# Patient Record
Sex: Female | Born: 1935 | Race: White | Hispanic: No | Marital: Married | Smoking: Never smoker
Health system: Southern US, Community
[De-identification: ages and names within clinical notes are randomized; demographics above are authoritative.]

## PROBLEM LIST (undated history)

## (undated) DIAGNOSIS — M1990 Unspecified osteoarthritis, unspecified site: Secondary | ICD-10-CM

## (undated) DIAGNOSIS — C801 Malignant (primary) neoplasm, unspecified: Secondary | ICD-10-CM

## (undated) DIAGNOSIS — I639 Cerebral infarction, unspecified: Secondary | ICD-10-CM

## (undated) DIAGNOSIS — I10 Essential (primary) hypertension: Secondary | ICD-10-CM

## (undated) DIAGNOSIS — E039 Hypothyroidism, unspecified: Secondary | ICD-10-CM

## (undated) DIAGNOSIS — I2699 Other pulmonary embolism without acute cor pulmonale: Secondary | ICD-10-CM

## (undated) DIAGNOSIS — R413 Other amnesia: Secondary | ICD-10-CM

## (undated) HISTORY — DX: Other amnesia: R41.3

---

## 1979-07-17 HISTORY — PX: CHOLECYSTECTOMY: SHX55

## 1983-07-17 HISTORY — PX: HIP SURGERY: SHX245

## 2003-07-08 ENCOUNTER — Ambulatory Visit (HOSPITAL_COMMUNITY): Admission: RE | Admit: 2003-07-08 | Discharge: 2003-07-08 | Payer: Self-pay | Admitting: Internal Medicine

## 2003-07-12 ENCOUNTER — Ambulatory Visit (HOSPITAL_COMMUNITY): Admission: RE | Admit: 2003-07-12 | Discharge: 2003-07-12 | Payer: Self-pay | Admitting: Internal Medicine

## 2003-07-13 ENCOUNTER — Ambulatory Visit (HOSPITAL_COMMUNITY): Admission: RE | Admit: 2003-07-13 | Discharge: 2003-07-13 | Payer: Self-pay | Admitting: Internal Medicine

## 2003-07-20 ENCOUNTER — Ambulatory Visit (HOSPITAL_COMMUNITY): Admission: RE | Admit: 2003-07-20 | Discharge: 2003-07-20 | Payer: Self-pay | Admitting: Internal Medicine

## 2003-07-27 ENCOUNTER — Ambulatory Visit (HOSPITAL_COMMUNITY): Admission: RE | Admit: 2003-07-27 | Discharge: 2003-07-27 | Payer: Self-pay | Admitting: Internal Medicine

## 2003-12-10 ENCOUNTER — Ambulatory Visit (HOSPITAL_COMMUNITY): Admission: RE | Admit: 2003-12-10 | Discharge: 2003-12-10 | Payer: Self-pay | Admitting: Internal Medicine

## 2004-06-13 ENCOUNTER — Ambulatory Visit (HOSPITAL_COMMUNITY): Admission: RE | Admit: 2004-06-13 | Discharge: 2004-06-13 | Payer: Self-pay | Admitting: Internal Medicine

## 2004-10-06 ENCOUNTER — Ambulatory Visit (HOSPITAL_COMMUNITY): Admission: RE | Admit: 2004-10-06 | Discharge: 2004-10-06 | Payer: Self-pay | Admitting: Specialist

## 2004-10-11 ENCOUNTER — Ambulatory Visit: Payer: Self-pay | Admitting: Internal Medicine

## 2005-02-08 ENCOUNTER — Encounter (HOSPITAL_COMMUNITY): Admission: RE | Admit: 2005-02-08 | Discharge: 2005-02-09 | Payer: Self-pay | Admitting: Internal Medicine

## 2005-02-23 ENCOUNTER — Ambulatory Visit (HOSPITAL_COMMUNITY): Admission: RE | Admit: 2005-02-23 | Discharge: 2005-02-23 | Payer: Self-pay | Admitting: Family Medicine

## 2005-02-26 ENCOUNTER — Ambulatory Visit: Payer: Self-pay | Admitting: Internal Medicine

## 2005-03-08 ENCOUNTER — Ambulatory Visit (HOSPITAL_COMMUNITY): Admission: RE | Admit: 2005-03-08 | Discharge: 2005-03-08 | Payer: Self-pay | Admitting: Internal Medicine

## 2005-10-25 ENCOUNTER — Ambulatory Visit (HOSPITAL_COMMUNITY): Admission: RE | Admit: 2005-10-25 | Discharge: 2005-10-25 | Payer: Self-pay | Admitting: Family Medicine

## 2005-11-08 ENCOUNTER — Ambulatory Visit: Payer: Self-pay | Admitting: Internal Medicine

## 2005-11-08 ENCOUNTER — Encounter (HOSPITAL_COMMUNITY): Admission: RE | Admit: 2005-11-08 | Discharge: 2005-12-08 | Payer: Self-pay | Admitting: Internal Medicine

## 2005-11-08 ENCOUNTER — Ambulatory Visit (HOSPITAL_COMMUNITY): Admission: RE | Admit: 2005-11-08 | Discharge: 2005-11-08 | Payer: Self-pay | Admitting: Family Medicine

## 2005-11-09 ENCOUNTER — Ambulatory Visit (HOSPITAL_COMMUNITY): Payer: Self-pay | Admitting: Internal Medicine

## 2005-11-20 ENCOUNTER — Ambulatory Visit (HOSPITAL_COMMUNITY): Admission: RE | Admit: 2005-11-20 | Discharge: 2005-11-20 | Payer: Self-pay | Admitting: Internal Medicine

## 2005-11-20 ENCOUNTER — Ambulatory Visit: Payer: Self-pay | Admitting: Internal Medicine

## 2005-12-12 ENCOUNTER — Ambulatory Visit (HOSPITAL_COMMUNITY): Admission: RE | Admit: 2005-12-12 | Discharge: 2005-12-12 | Payer: Self-pay | Admitting: Internal Medicine

## 2005-12-12 ENCOUNTER — Encounter (INDEPENDENT_AMBULATORY_CARE_PROVIDER_SITE_OTHER): Payer: Self-pay | Admitting: *Deleted

## 2006-02-27 ENCOUNTER — Ambulatory Visit: Payer: Self-pay | Admitting: Internal Medicine

## 2006-04-24 ENCOUNTER — Ambulatory Visit: Payer: Self-pay | Admitting: Internal Medicine

## 2006-07-19 ENCOUNTER — Ambulatory Visit: Payer: Self-pay | Admitting: Internal Medicine

## 2006-11-08 ENCOUNTER — Ambulatory Visit: Payer: Self-pay | Admitting: Internal Medicine

## 2006-11-08 DIAGNOSIS — I10 Essential (primary) hypertension: Secondary | ICD-10-CM | POA: Insufficient documentation

## 2006-11-08 DIAGNOSIS — E042 Nontoxic multinodular goiter: Secondary | ICD-10-CM

## 2006-11-08 DIAGNOSIS — R000 Tachycardia, unspecified: Secondary | ICD-10-CM

## 2006-11-08 DIAGNOSIS — D509 Iron deficiency anemia, unspecified: Secondary | ICD-10-CM | POA: Insufficient documentation

## 2006-11-08 DIAGNOSIS — E1165 Type 2 diabetes mellitus with hyperglycemia: Secondary | ICD-10-CM

## 2006-11-08 DIAGNOSIS — M129 Arthropathy, unspecified: Secondary | ICD-10-CM | POA: Insufficient documentation

## 2006-11-08 LAB — CONVERTED CEMR LAB
Blood Glucose, Fingerstick: 175
Hemoglobin: 9.6 g/dL

## 2006-11-12 ENCOUNTER — Encounter (INDEPENDENT_AMBULATORY_CARE_PROVIDER_SITE_OTHER): Payer: Self-pay | Admitting: Internal Medicine

## 2006-11-22 ENCOUNTER — Ambulatory Visit: Payer: Self-pay | Admitting: Internal Medicine

## 2006-11-25 ENCOUNTER — Ambulatory Visit (HOSPITAL_COMMUNITY): Admission: RE | Admit: 2006-11-25 | Discharge: 2006-11-25 | Payer: Self-pay | Admitting: Internal Medicine

## 2006-11-25 LAB — CONVERTED CEMR LAB
Basophils Absolute: 0 10*3/uL (ref 0.0–0.1)
Basophils Relative: 0 % (ref 0–1)
Eosinophils Absolute: 0.1 10*3/uL (ref 0.0–0.7)
Eosinophils Relative: 2 % (ref 0–5)
Ferritin: 10 ng/mL (ref 10–291)
HCT: 33.3 % — ABNORMAL LOW (ref 36.0–46.0)
Hemoglobin: 9.6 g/dL — ABNORMAL LOW (ref 12.0–15.0)
Iron: 23 ug/dL — ABNORMAL LOW (ref 42–145)
Lymphocytes Relative: 29 % (ref 12–46)
Lymphs Abs: 1.1 10*3/uL (ref 0.7–3.3)
MCHC: 28.8 g/dL — ABNORMAL LOW (ref 30.0–36.0)
MCV: 78.9 fL (ref 78.0–100.0)
Monocytes Absolute: 0.2 10*3/uL (ref 0.2–0.7)
Monocytes Relative: 6 % (ref 3–11)
Neutro Abs: 2.4 10*3/uL (ref 1.7–7.7)
Neutrophils Relative %: 63 % (ref 43–77)
Platelets: 230 10*3/uL (ref 150–400)
RBC: 4.22 M/uL (ref 3.87–5.11)
RDW: 18.6 % — ABNORMAL HIGH (ref 11.5–14.0)
WBC: 3.8 10*3/uL — ABNORMAL LOW (ref 4.0–10.5)

## 2006-11-26 ENCOUNTER — Telehealth (INDEPENDENT_AMBULATORY_CARE_PROVIDER_SITE_OTHER): Payer: Self-pay | Admitting: Internal Medicine

## 2006-12-18 ENCOUNTER — Ambulatory Visit: Payer: Self-pay | Admitting: Internal Medicine

## 2007-01-07 ENCOUNTER — Encounter (INDEPENDENT_AMBULATORY_CARE_PROVIDER_SITE_OTHER): Payer: Self-pay | Admitting: Internal Medicine

## 2007-02-17 ENCOUNTER — Ambulatory Visit: Payer: Self-pay | Admitting: Internal Medicine

## 2007-02-17 LAB — CONVERTED CEMR LAB
Hemoglobin: 8.1 g/dL
Hgb A1c MFr Bld: 6.3 %

## 2007-03-25 ENCOUNTER — Ambulatory Visit: Payer: Self-pay | Admitting: Internal Medicine

## 2007-03-31 ENCOUNTER — Ambulatory Visit: Payer: Self-pay | Admitting: Internal Medicine

## 2007-03-31 DIAGNOSIS — D518 Other vitamin B12 deficiency anemias: Secondary | ICD-10-CM | POA: Insufficient documentation

## 2007-03-31 LAB — CONVERTED CEMR LAB: Hemoglobin: 7.9 g/dL

## 2007-04-01 ENCOUNTER — Telehealth (INDEPENDENT_AMBULATORY_CARE_PROVIDER_SITE_OTHER): Payer: Self-pay | Admitting: Internal Medicine

## 2007-04-01 LAB — CONVERTED CEMR LAB
ALT: 12 units/L (ref 0–35)
AST: 14 units/L (ref 0–37)
Albumin: 3.5 g/dL (ref 3.5–5.2)
Alkaline Phosphatase: 106 units/L (ref 39–117)
BUN: 8 mg/dL (ref 6–23)
Basophils Absolute: 0 10*3/uL (ref 0.0–0.1)
Basophils Relative: 0 % (ref 0–1)
CO2: 24 meq/L (ref 19–32)
Calcium: 8.2 mg/dL — ABNORMAL LOW (ref 8.4–10.5)
Chloride: 108 meq/L (ref 96–112)
Cholesterol: 143 mg/dL (ref 0–200)
Creatinine, Ser: 0.68 mg/dL (ref 0.40–1.20)
Eosinophils Absolute: 0.1 10*3/uL (ref 0.0–0.7)
Eosinophils Relative: 3 % (ref 0–5)
Ferritin: 8 ng/mL — ABNORMAL LOW (ref 10–291)
Glucose, Bld: 185 mg/dL — ABNORMAL HIGH (ref 70–99)
HCT: 30.6 % — ABNORMAL LOW (ref 36.0–46.0)
HDL: 40 mg/dL (ref >39)
Hemoglobin: 8.9 g/dL — ABNORMAL LOW (ref 12.0–15.0)
Iron: 23 ug/dL — ABNORMAL LOW (ref 42–145)
LDL Cholesterol: 82 mg/dL (ref 0–99)
Lymphocytes Relative: 29 % (ref 12–46)
Lymphs Abs: 1.1 10*3/uL (ref 0.7–3.3)
MCHC: 29.1 g/dL — ABNORMAL LOW (ref 30.0–36.0)
MCV: 78.9 fL (ref 78.0–100.0)
Monocytes Absolute: 0.3 10*3/uL (ref 0.2–0.7)
Monocytes Relative: 7 % (ref 3–11)
Neutro Abs: 2.3 10*3/uL (ref 1.7–7.7)
Neutrophils Relative %: 61 % (ref 43–77)
Platelets: 167 10*3/uL (ref 150–400)
Potassium: 4 meq/L (ref 3.5–5.3)
RBC: 3.88 M/uL (ref 3.87–5.11)
RDW: 16 % — ABNORMAL HIGH (ref 11.5–14.0)
Saturation Ratios: 7 % — ABNORMAL LOW (ref 20–55)
Sodium: 141 meq/L (ref 135–145)
TIBC: 352 ug/dL (ref 250–470)
TSH: 2.322 microintl units/mL (ref 0.350–5.50)
Total Bilirubin: 1.1 mg/dL (ref 0.3–1.2)
Total CHOL/HDL Ratio: 3.6
Total Protein: 6.5 g/dL (ref 6.0–8.3)
Triglycerides: 103 mg/dL (ref <150)
UIBC: 329 ug/dL
VLDL: 21 mg/dL (ref 0–40)
Vitamin B-12: 1058 pg/mL — ABNORMAL HIGH (ref 211–911)
WBC: 3.7 10*3/uL — ABNORMAL LOW (ref 4.0–10.5)

## 2007-04-03 ENCOUNTER — Telehealth (INDEPENDENT_AMBULATORY_CARE_PROVIDER_SITE_OTHER): Payer: Self-pay | Admitting: Internal Medicine

## 2007-04-14 ENCOUNTER — Ambulatory Visit: Payer: Self-pay | Admitting: Internal Medicine

## 2007-04-14 DIAGNOSIS — I872 Venous insufficiency (chronic) (peripheral): Secondary | ICD-10-CM | POA: Insufficient documentation

## 2007-04-14 LAB — CONVERTED CEMR LAB: Hemoglobin: 8.6 g/dL

## 2007-05-13 ENCOUNTER — Ambulatory Visit: Payer: Self-pay | Admitting: Internal Medicine

## 2007-05-26 ENCOUNTER — Ambulatory Visit: Payer: Self-pay | Admitting: Internal Medicine

## 2007-05-26 DIAGNOSIS — R252 Cramp and spasm: Secondary | ICD-10-CM

## 2007-05-26 LAB — CONVERTED CEMR LAB
Blood Glucose, Fingerstick: 190
Hgb A1c MFr Bld: 6.6 %

## 2007-07-17 HISTORY — PX: COLON SURGERY: SHX602

## 2007-10-10 ENCOUNTER — Encounter (INDEPENDENT_AMBULATORY_CARE_PROVIDER_SITE_OTHER): Payer: Self-pay | Admitting: Internal Medicine

## 2007-12-29 ENCOUNTER — Encounter (INDEPENDENT_AMBULATORY_CARE_PROVIDER_SITE_OTHER): Payer: Self-pay | Admitting: Internal Medicine

## 2008-02-27 ENCOUNTER — Ambulatory Visit: Payer: Self-pay | Admitting: Cardiology

## 2008-05-11 ENCOUNTER — Ambulatory Visit (HOSPITAL_COMMUNITY): Admission: RE | Admit: 2008-05-11 | Discharge: 2008-05-11 | Payer: Self-pay | Admitting: Internal Medicine

## 2010-11-28 NOTE — Assessment & Plan Note (Signed)
NAMEKELSE, PLOCH                CHART#:  [id number]   DATE:  12/18/2006                       DOB:  Dec 26, 1934   PRESENTING COMPLAINT:  Followup for iron-deficiency anemia.   SUBJECTIVE:  Ambrie is a 76 year old Caucasian female who is here for  scheduled visit.  She was last seen in January 2008.   She has iron-deficiency anemia.  She also has history of B12 deficiency  which was diagnosed by Dr. Mirna Mires.  As far as her iron-deficiency  anemia is concerned she has undergone endoscopic evaluation.   She had colonoscopy initially in December 2004 for weight loss and  diarrhea and anemia, and she had normal exam other than hemorrhoids.   She had EGD in January 2005, and she had antral erythema and erosions  but no evidence of esophageal varices or gastric varices.  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  She has been advised on liver biopsy and other studies, but she has  declined.  Her H&H dropped again in April last year and she was given 2  units of PRBCs.  She had small bowel Given capsule study on Nov 20, 2005.  She had multiple gastric erosions without stigmata of bleeding and  villous prominence to duodenal and proximal jejunal mucosa.  She  therefore had EGD with duodenal biopsy.  There was no evidence of  villous atrophy or inflammation to suggest celiac disease.  She has been  maintained on iron since then.   She does not have any symptoms.  She has noted some heartburn lately  after meals.  Her stool has been dark since she is on iron.  She has not  had any rectal bleeding.  She also denies vaginal bleeding or hematuria.  She apparently saw Dr. Jen Mow as her primary care physician.  Since her  hemoglobin A1c was 6.1, Dr. Jen Mow suggested stopping her glimepiride.  Her glucose went up to 300, and she is now back on it.  She denies  anorexia or weight loss.  She has been under stress lately because her  father was in the  hospital with pneumonia and today is being transferred  to [facility].   She is on ferrous sulfate one b.i.d., B6 daily, Synthroid 25 mcg daily,  B12 injection once a month, glimepiride 4 mg q.a.m. and metformin 500 mg  daily p.r.n.   OBJECTIVE:  VITAL SIGNS:  Weight 169 pounds.  She is 5 feet 6 inches  tall.  Pulse 88 per minute.  Blood pressure 130/70.  Temp is 98.4.  HEENT:  Conjunctivae are somewhat pale.  Sclerae nonicteric.  Oropharyngeal mucosa is normal.  No adenopathy noted.  She has mild  thyromegaly.  ABDOMEN:  Soft and nontender.  No splenomegaly or hepatomegaly.  EXTREMITIES:  She does not have peripheral edema, clubbing or  koilonychia.   H&H from October 28, 2006, was 9.4 and 32.2.   H&H from Nov 22, 2006, was 9.6 and 33.3.  MCV was 78.9.  Serum iron 23.  Ferritin was 10 ng/mL.   ASSESSMENT:  Preslyn has iron-deficiency anemia.  Her stool was guaiac  negative on her last visit.  She is obviously not responding to oral  iron therapy.  She has had fairly extensive workup.  She possibly has  impaired iron absorption  and may need parenteral iron therapy.  Before  this is carried out I would like for her to try a different iron  preparation.   PLAN:  1. Discontinue ferrous sulfate.  2. Will start Nu-Iron 150 b.i.d., prescription given for #60 with five      refills.  She will have H&H repeated in one month from now.   I asked Kristeen to call Dr. Jen Mow' office and let them know about her  blood glucose levels and the fact that she is back on glimepiride.       Lionel December, M.D.  Electronically Signed     NR/MEDQ  D:  12/19/2006  T:  12/19/2006  Job:  [id number]   cc:   Erle Crocker, M.D.

## 2010-12-01 NOTE — Op Note (Signed)
Valerie Boyd, Valerie Boyd               ACCOUNT NO.:  [id number]   MEDICAL RECORD NO.:  [id number]          PATIENT TYPE:  AMB   LOCATION:  DAY                           FACILITY:  [facility]   PHYSICIAN:  Lionel December, M.D.    DATE OF BIRTH:  [birth year]   DATE OF PROCEDURE:  12/12/2005  DATE OF DISCHARGE:                                 OPERATIVE REPORT   PROCEDURE:  Esophagogastroduodenoscopy with duodenal biopsy.   INDICATIONS:  Valerie Boyd is a 76 year old Caucasian female with history of iron-  deficiency anemia, who was recently noted to have a hemoglobin of 6.6 and  heme-positive stool.  She was given 2 units of PRBCs.  She had a Given  capsule study since she has had a prior EGD and colonoscopy.  She has  villous blunting and edema to duodenal and jejunal mucosa.  She is therefore  undergoing EGD with duodenal biopsy.  Procedure and risks were reviewed with  the patient and informed consent was obtained.   MEDICATIONS FOR CONSCIOUS SEDATION:  Benzocaine spray for pharyngeal topical  anesthesia, Demerol 25 mg IV, Versed 4 mg IV.   FINDINGS:  Procedure performed in endoscopy suite.  The patient's vital  signs and O2 saturation were monitored during the procedure and remained  stable.  The patient was placed in the left lateral recumbent position and  the Olympus video scope was passed via oropharynx without any difficulty  into esophagus.   Esophagus:  Mucosa was normal throughout.  The GE junction was at 35 cm from  the incisors.  There were no esophageal varices noted.   Stomach:  It was empty and distended very well with insufflation.  Folds of  the proximal stomach were normal.  Examination of the mucosa revealed two  erosions at antrum and round red rings with pale centers at body and antrum,  none of these was bleeding.  Pictures taken for the record.  Pyloric channel  was patent.  Angularis, fundus and cardia were examined by retroflexing the  scope and were normal.  No fundal  varices identified.   Duodenum:  Bulbar mucosa was normal.  The scope was passed in the second and  third part of duodenum, where there was mucosal edema with thickening of the  folds.  A biopsy was taken from the second and third part and submitted in  one container.  There was a single small diverticulum involving third part  of the duodenum.  Endoscope was withdrawn.  The patient tolerated the  procedure well.   FINAL DIAGNOSES:  1.  No evidence of esophageal or gastric varices.  2.  Two gastric erosions at antrum.  Please note her H. pylori serology last      year was negative.  3.  Localized areas with mucosal erythema, possibly indicative of early      portal gastropathy, or these could be arteriovenous malformations.  None      of these were bleeding and therefore treated.  4.  Mucosal edema to postbulbar duodenum.  Biopsy taken to rule out mucosal      disease.  These  changes may well be due to underlying portal      hypertension since she has known to mesenteric varices, although she has      declined to have hepatoportal pressures and liver biopsy.   RECOMMENDATIONS:  The patient will continue ferrous sulfate 325 mg b.i.d.  She will have H&H today.  I will be contacting her with biopsy results and  further recommendations.      Lionel December, M.D.  Electronically Signed     NR/MEDQ  D:  12/12/2005  T:  12/12/2005  Job:  [id number]   cc:   Annia Friendly. Loleta Chance, MD  Fax: [phone]

## 2010-12-01 NOTE — H&P (Signed)
[name], [name]               ACCOUNT NO.:  [id number]   MEDICAL RECORD NO.:  [id number]          PATIENT TYPE:  AMB   LOCATION:  DAY                           FACILITY:  [facility]   PHYSICIAN:  Lionel December, M.D.    DATE OF BIRTH:  05-28-1935   DATE OF ADMISSION:  DATE OF DISCHARGE:  LH                                HISTORY & PHYSICAL   PRESENTING COMPLAINT:  Low hemoglobin of 6.6 g.   HISTORY OF PRESENT ILLNESS:  Valerie Boyd is 76 year old Caucasian female who is  well-known to [place] who was last seen in August and noted to have persistent  anemia.  She has history of iron deficiency anemia dating back to 2005 which  is reviewed in the past medical history.  At that time her hemoglobin was  8.9 and hematocrit was 26.8.  Given capsule study was scheduled.  However,  she had FNA of thyroid cyst and she possibly had some hemorrhage and study  was canceled and was never rescheduled.  We were under the impression that  she was going to call us and she was under the impression that we were going  to call her.  She was having some problems with iron therapy and she  discontinued it a few months ago.  She has been gradually getting weaker.  She has no exertional dyspnea after taking less than one flight of stairs.  However, she has not had any chest pain.  She saw Dr. Loleta Chance.  She had a CBC  yesterday.  Her H&H was 6.6 and 21.5.  She also had a met-7 which was  normal.  Her calcium was 8.6.  She was therefore advised to come to our  office.  Lemoyne denies melena or rectal bleeding.  Two weeks ago she had  bout of diarrhea and noted blood on the tissue but this resolved after a few  days.  She denies abdominal pain, nausea, vomiting, or heartburn.  She also  denies hematuria or vaginal bleeding.  Over the last several days she has  noted pain in her right flank area.  She had abdominal film on October 25, 2005.  She had calcification in the right pelvis felt to be consistent with  phlebolith and  probable right uterine fibroid calcification and she had DJD  changes to her right hip but no acute abnormalities were noted.  Kenzley  denies using NSAIDs or aspirin.  She recalls that she was given blood  transfusion at age 76 and never thereafter.  She has been feeling sluggish  and has been dragging over the last few weeks.   [name] had colonoscopy and EGD as below.   She is on Amaryl 4 mg q.a.m., Centrum daily, Fortamet 1000 mg q.a.m., OTC  iron 65 mg b.i.d., Benicar/HCT 40 mg daily, Coreg 3.125 mg daily, Synthroid  25 mcg daily, B6 daily, and B12 injection once a month.   PAST MEDICAL HISTORY:  1.  She has goiter which was diagnosed about 12 years ago.  2.  She had FNA by Dr. Lovell Sheehan and more recently by Dr. Patrecia Pace.  3.  She has diabetes mellitus.  Her hemoglobin A1c reportedly was normal two      weeks ago.   [name] presented with 40 pound weight loss and diarrhea back in December  2004 which was first visit to our office.  At that time she was noted to  have anemia which turned out to be due to iron deficiency.  She had serum  iron of 28, TIBC of 285, saturation was 7%, B12 was borderline at 220 and  folate was 14.7.  Her ferritin was 7.  She had colonoscopy in December 2004.  She had normal TI and normal colon.  She had external hemorrhoids.  She had  EGD in January 2005 which was negative for varices, but positive for  erosions at antrum.  Biopsy revealed gastritis and H. pylori serology was  negative.  She had abdominopelvic CT on July 13, 2003.  She had mild  splenic and hepatic prominence without focal defects where she had a very  prominent perisplenic varices.  There was a scant amount of pelvic fluid.  In May 2005 she was treated for pneumonia.  She had abnormal chest CT but  she had a follow-up chest CT in November 2005 which showed stable  interstitial changes, but no masses.   Regarding her varices __________ recommended transjugular liver biopsy along   with hepatic wedge pressure, but she declines.   Donja was last seen in our office in August 2006.  She had her anemia  profile repeated.  Her iron was 25, TIBC 352, saturation was 7%, B12 was  683, folate 18, and ferritin was 10.  She was scheduled for Given capsule  but this was postponed or canceled for reasons stated above.   She has had cholecystectomy in 1981.   ALLERGIES:  NK.   FAMILY HISTORY:  Mother died of brain tumor age 12.  She also had thyroid  disease.   SOCIAL HISTORY:  She is married.  She has two children.  She is a retired  Astronomer.  She has never smoked cigarettes and drinks wine occasionally.   OBJECTIVE:  VITAL SIGNS:  Weight 171 pounds.  She is 5 feet 5 inches tall.  Pulse 72 per minute; however, when she moved from chair to examination table  it jumped to 100 for a couple minutes.  Blood pressure 158/68, temperature  is 98.6.  HEENT:  Conjunctiva is pale.  Sclerae is non-icteric.  Oropharyngeal mucosa  is normal.  No neck masses are noted.  She has thyromegaly.  Right lobe is  larger than left lobe.  It is firm and nontender.  No lymphadenopathy noted.  CARDIAC:  Regular rhythm.  Normal S1, S2.  No murmur or gallop noted.  LUNGS:  Clear to auscultation.  BACK:  No abnormality noted to her thoracic or lumbar spine.  No tenderness.  No rebound on percussion.  No tenderness noted over CVA.  ABDOMEN:  Full, but soft and nontender.  Liver edge is palpable below RCM,  does not appear to be enlarged.  Spleen is nonpalpable.  RECTAL:  External hemorrhoids.  She has scant amount of stool in the vault  which is very dark and is faintly, but definitely heme-positive.  She has  trace edema around her ankles.   ASSESSMENT:  [name] has iron deficiency anemia dating back to at least  January 2005.  Her hemoglobin has come up with iron therapy to close to 10.5-  11 g, but never above that.  Now she  presents with significant drop in her H&H but she has minimal symptoms.  I  suspect this has been happening  gradually.  She has had EGD in December.  I suspect her drop has been very  gradual.  She has not had any overt frank bleeding.  Therefore, we could be  dealing with iron malabsorption.  She had colonoscopy in December 2004 and  EGD in January 2005.  Nothing significant was found on these studies.   She could also have small bowel AVMs or even malabsorption given that she  also has B12 deficiency which is not mentioned in her past medical history.   Given her age I would recommend that she be transfused.  In order to look at  her small bowel with a Given capsule we have to stop her iron for at least  10 days.  If we do not give her PRBCs her evaluation will have to be delayed  for a few weeks.  Pros and cons of transfusion was discussed with the  patient and she is comfortable being transfused.   RECOMMENDATIONS:  1.  Patient asked to discontinue iron therapy in preparation for Given      capsule study which will be scheduled week after next.  2.  She will be given 2 units of PRBCs via a specialty clinic in the a.m.      but she will be typed and cross-matched today.  She will have post      transfusion H&H.   Further work-up will depend on findings of Given capsule study.   As far as the flank pain is concerned I suspect this may be related to her  spine.  This will be worked up once her acute issue has been addressed.      Lionel December, M.D.  Electronically Signed     NR/MEDQ  D:  11/08/2005  T:  11/08/2005  Job:  [id number]   cc:   Annia Friendly. Loleta Chance, MD  Fax: [phone]   Dirk Dress. Katrinka Blazing, M.D.  Fax: [phone]

## 2010-12-01 NOTE — Op Note (Signed)
NAMEMALEEA, CAMILO               ACCOUNT NO.:  [id number]   MEDICAL RECORD NO.:  [id number]          PATIENT TYPE:  AMB   LOCATION:  DAY                           FACILITY:  [facility]   PHYSICIAN:  Lionel December, M.D.    DATE OF BIRTH:  1935/06/09   DATE OF PROCEDURE:  11/20/2005  DATE OF DISCHARGE:  11/20/2005                                 OPERATIVE REPORT   PROCEDURE:  Small bowel Gibbon capsule study.   INDICATIONS:  Patient is a 76 year old Caucasian female with recurrent iron  deficiency anemia which was initially diagnosed in 2005.  Recently her  hemoglobin was down to 6.6 grams.  When seen in the office recently her  stool was noted to be heme-positive.  She was given 2 units of PRBCs.  Her  iron therapy was discontinued for this study.  The procedures and risks were  reviewed with the patient, and informed consent was obtained.   FINDINGS:  The patient was able to swallow Givens capsule without any  difficulty.  It passed through esophagus quickly, and reached the stomach in  1 minute and 24 seconds.  No mucosal abnormality was noted in the esophagus.   It reached the duodenal bulb in 1 hour and 18 minutes.   Capsule never made it to the cecum, but it appears to be in the ileum or  terminal ileum by the time the study ended, which was 8 hours.   No esophageal mucosal abnormality noted.  Multiple gastric erosions noted.  None with stigmata of bleeding.   Small bowel mucosa is normal.  There is some black material which is felt to  be iron even though iron therapy was discontinued several days ago.   FINAL DIAGNOSIS:  1.  Multiple gastric erosions noted some 3-4 mm, but none with stigmata of      bleeding.  2.  There is villous edema or clubbing as well as thickening or edema to      folds in the duodenum and proximal jejunum.  Mucosa of the distal small      bowel is normal.  There was some black material felt to be iron, even      though iron therapy was stopped several  days ago.   FINAL DIAGNOSIS:  1.  Multiple gastric erosions without stigmata of bleed.  2.  Villous blunting with mucosal edema to the duodenum and proximal      jejunum; this may be related to portal hypertension since she has known      perisplenic varices.   RECOMMENDATIONS:  1.  She will resume her iron therapy as before.  2.  She will undergo diagnostic esophagogastroduodenoscopy with duodenal      biopsy to rule out celiac disease.      Lionel December, M.D.  Electronically Signed     NR/MEDQ  D:  11/30/2005  T:  11/30/2005  Job:  [id number]   cc:   Annia Friendly. Loleta Chance, MD  Fax: [phone]

## 2010-12-01 NOTE — Op Note (Signed)
NAME:  Boyd, Valerie G                         ACCOUNT NO.:  [id number]   MEDICAL RECORD NO.:  [id number]                   PATIENT TYPE:  AMB   LOCATION:  DAY                                  FACILITY:  [facility]   PHYSICIAN:  Lionel December, M.D.                 DATE OF BIRTH:  03-May-1935   DATE OF PROCEDURE:  07/12/2003  DATE OF DISCHARGE:                                 OPERATIVE REPORT   PROCEDURE:  Total colonoscopy.   INDICATIONS:  Terrian is a 76 year old Caucasian female with about a six-  week history of nonbloody diarrhea who also has had significant weight loss  and microcytic anemia.  However, there is no history of melena or rectal  bleeding.  She is undergoing colonoscopy both for diagnostic and screening  purposes.  Procedure was reviewed with the patient and informed consent was  obtained.   PREOPERATIVE MEDICATIONS:  Demerol 25 mg IV, Versed 4 mg IV in divided  doses.   FINDINGS:  The procedure was performed in the endoscopy suite.  The  patient's vital signs and O2 saturation were monitored during the procedure  and remained stable.  The patient was placed in the left lateral recumbent  position, and rectal examination performed.  She had soft sentinel skin  tags.  This exam was normal.  The scope was placed in the rectum and  advanced under vision into the sigmoid colon and beyond.  Preparation was  satisfactory.  The scope was passed to the cecum which was identified by the  ileocecal valve and appendiceal orifice.  Picture was taken for the record.  Short segment of terminal ileum was also examined and was normal.  As the  scope was withdrawn, colonic mucosa was carefully examined and was normal  throughout.  Incidentally, she had some formed stool entering in the cecum.  No diverticula were noted.  Perirectal mucosa was normal.  Scope was  retroflexed to examine the anorectal junction and she had hemorrhoids below  the dentate line.  Endoscope was straightened and  withdrawn.  The patient  tolerated the procedure well.   FINAL DIAGNOSIS:  Small external hemorrhoids; otherwise normal colonoscopy  and terminal ileoscopy.   RECOMMENDATIONS:  1. Dicyclomine 20 mg p.o. before breakfast and the evening meal.  2. Citrucel one tablespoonful daily.  3. Will check hemoglobin A1C, iron TIBC, ferritin, B12, folate level.  4. Schedule for abdominopelvic CT scan to further evaluate her right upper     quadrant pain and weight loss.  5. Have her see a dietitian for teaching in reference to new onset of     diabetes mellitus which may well explain her weight loss; however, would     not explain her acute abdominal pain.      ___________________________________________  Lionel December, M.D.   NR/MEDQ  D:  07/12/2003  T:  07/12/2003  Job:  [id number]

## 2010-12-01 NOTE — H&P (Signed)
NAME:  Valerie Boyd, Valerie Boyd                    ACCOUNT NO.:  [id number]   MEDICAL RECORD NO.:  [id number]                   PATIENT TYPE:   LOCATION:                                       FACILITY:  [facility]   PHYSICIAN:  Lionel December, M.D.                 DATE OF BIRTH:  1935-03-20   DATE OF ADMISSION:  DATE OF DISCHARGE:                                HISTORY & PHYSICAL   OFFICE NOTE   PRESENTING COMPLAINT:  Diarrhea of 5 weeks duration with weight loss, right-  sided abdominal and back pain and cough.   HISTORY OF PRESENT ILLNESS:  Valerie Boyd is a 76 year old Caucasian female who  is being seen at the request of her daughter Ms. Colan Neptune who is an Charity fundraiser  at [employer].  She currently does not have a family physician since her physician  recently moved out of [place].   Her present illness began about 2 weeks before Thanksgiving when she  developed an acute onset of diarrhea and she also had nausea and vomiting.  For the first 3-4 days she had several stools, as many as 20 per day, but  thereafter the frequency has slowed and now she has 3-4 stools per day.  Generally every time she eats she has a bowel movement.  The stools are  liquid, presently they are like jelly.  She has not had any melena or rectal  bleeding.  She complains of discomfort in her left lower quadrant and she  also has pain in the right upper quadrant as well as infrascapular area  which comes and goes.  Her appetite is fair. She has had some nausea.  She  also has been coughing up for about a month.  She denies heartburn or  hoarseness.  She, at times, regurgitates food when she coughs.  She does not  have dysphagia.  She did have hard chills with onset of her symptoms, none  thereafter. She recalls that she ate some barbeque and now she wonders if it  was spoiled.  Prior to the onset of her diarrhea she generally would have a  BM daily.  She did take Keflex after the onset of diarrhea, but it did not  help her symptoms.   Her daughter feels that she has lost several pounds.  She thinks that she has lost 40 pounds.  The patient, however, states that  she has never weighed more than 145 pounds, but she did not check her weight  recently. Her dress size has gone from 16 down to 12.   MEDICATIONS:  She is presently on Imodium OTC and a fiber, but has not noted  much relief previously when she has taken Lomotil.   REVIEW OF SYSTEMS:  Positive for thyroid swelling. She has been diagnosed  with goiter in the past, but she has not had recent evaluation.  She has not  had any discomfort on swallowing.   PAST MEDICAL  HISTORY:  She had FNA by Goiter was diagnosed 10 years ago.  She had FNA Dr. Lovell Sheehan, but decided not to follow up.  She has not had her  thyroid status checked recently.  She had open cholecystectomy in 1981 for  symptomatic cholelithiasis.   ALLERGIES:  NK.   FAMILY HISTORY:  1. Mother had brain tumor and died at age 31.  She was also hypertensive and     had been treated for thyrotoxicosis.  2. Father has had CAD and has anemia and is not doing well and she is     looking after him.   SOCIAL HISTORY:  She is married.  She has 2 children. She lost 1 daughter at  age 2 months of congenital cardiac problems. She is a retired Charity fundraiser.  She had  worked at [employer] for over 12 years and she worked a few  years at [employer].  She has never smoked cigarettes and she drinks wine  occasionally.   PHYSICAL EXAMINATION:  GENERAL:  A pleasant, well-developed, well-nourished,  Caucasian female who does not appear to be in any distress.  VITAL SIGNS:  She weighs 166-1/2 pounds. She is 5 feet 5 inches tall. Pulse  79 per minute, blood pressure 138/80.  Temperature is 98.5.  HEENT:  Conjunctivae are pink.  Sclerae are nonicteric.  Oropharyngeal  mucosa is normal.  NECK:  She has thyromegaly which is predominately involving the left lobe.  It is firm, but nontender and moves when she swallows.  No  lymphadenopathy  noted. Carotids are without any bruits.  CARDIAC:  Exam with regular rhythm.  Normal S1-S2.  No murmur or gallop  noted.  LUNGS:  Clear to auscultation.  ABDOMEN:  Symmetrical.  Bowel sounds are normal.  Palpation reveals soft  abdomen with mild tenderness at LLQ.  Liver edge is easily palpable 3 cm  below RCM.  It is firm and nontender.  Spleen is not palpable.  RECTAL:  Examination reveals normal sphincter tone.  She has scant amount of  stool in the vault, not formed, and it is guaiac negative.  She does not  have clubbing or peripheral edema.   ASSESSMENT:  1. Valerie Boyd is a 76 year old Caucasian female who presents with a 5-week     history of non-bloody diarrhea; and she also appears to have significant     weight loss.  This may well turn out to be a post infectious diarrhea,     but need to make sure that she does not have colitis.  A colonic neoplasm     or IBS would be less likely.  2. A cough of at least 4 weeks duration.  She does not have typical symptoms     of GERD.  We need to make sure that she does not have a pulmonary     process, but this may turn out to be an atypical manifestation of     gastroesophageal reflux disease.  3. Goiter.  Clinically she appears to be euthyroid.  Thyroid status needs to     be checked.  If she is thyroid toxic that would explain her diarrhea and     weight loss.  4. Right-sided upper abdominal and back pain.  I am not sure if this is     related to her diarrhea or her musculoskeletal pain.  She may have to be     evaluated for this at some point.   RECOMMENDATIONS:  1. Will start her  on Protonix 40 mg p.o. q.a.m. samples given.  Will get a     CBC, comprehensive chemistry panel, TSH, and a chest x-ray.  She will     undergo a total colonoscopy on July 12, 2003.  2. Further recommendations will be made after review of her lab studies,     chest x-ray, and colonoscopy. 3. I also asked Valerie Boyd that she needs to find  another primary care     physician as she needs to get her pelvic, pap and other usual preventive     interventions.     ___________________________________________                                         Lionel December, M.D.   NR/MEDQ  D:  07/08/2003  T:  07/08/2003  Job:  [id number]

## 2010-12-01 NOTE — Op Note (Signed)
NAME:  Valerie Boyd, Valerie Boyd                         ACCOUNT NO.:  [id number]   MEDICAL RECORD NO.:  [id number]                   PATIENT TYPE:  AMB   LOCATION:  DAY                                  FACILITY:  [facility]   PHYSICIAN:  Lionel December, M.D.                 DATE OF BIRTH:  03-27-1935   DATE OF PROCEDURE:  DATE OF DISCHARGE:                                 OPERATIVE REPORT   PROCEDURE:  Esophagogastroduodenoscopy.   ENDOSCOPIST:  Lionel December, M.D.   INDICATIONS:  This patient is a 76 year old Caucasian female who was  evaluated recently for weight loss and diarrhea.  She had colonoscopy the  week before last followed by abdominopelvic CT.  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  The splenic vein is patent.  She does not  have any history of liver disease.  She is returning for EGD to find out  whether or not she has esophageal and/or gastric varices.  The procedure and  risks were reviewed with the patient and informed consent was obtained.   PREOPERATIVE MEDICATIONS:  Cetacaine spray for oropharyngeal topical  anesthesia, Demerol 50 mg IV and Versed 3 mg IV.   FINDINGS:  Procedure performed in endoscopy suite.  The patient's vital  signs and O2 saturation were monitored during the procedure and remained  normal.  The patient was placed in the left lateral recumbent position and  Olympus videoscope was passed via the oropharynx without any difficulty into  the esophagus.   ESOPHAGUS:  Mucosa of the esophagus was normal throughout.  Squamocolumnar  junction was unremarkable.  There were no esophageal varices noted.   STOMACH:  It was empty and distended very well with insufflation.  The folds  of the proximal stomach were normal.  There were 2 extrinsic pulsatile  masses along the posterior wall.  The proximal one was much larger than the  distal one.  One was well seen on the endoscopic picture.  There were  multiple, very discrete, slightly raised  mucosal areas at gastric body.  Some of these are central erosion.  Biopsy was taken from these areas for  histology to make sure that there is no neoplastic process.  Antral mucosa,  however, was spared.  The pyloric channel was patent.  Angularis, fundus,  and cardia were examined by retroflexing the scope and were normal.  There  were no fundal varices.   DUODENUM:  Examination of the bulb and second part of the duodenum was also  normal. Endoscope was withdrawn   The patient tolerated the procedure well.   FINAL DIAGNOSES:  1. No evidence of esophageal or fundal varices.  The varices are extrinsic     along the posterior aspect of the gastric body.  2. Discrete areas of mucosal erythema and edema with erosions at gastric     body.  Possibly equivalent of a gastritis  or watermelon stomach.  Some     form of possibly gastritis.   RECOMMENDATIONS:  1. She will continue her Protonix 40 mg q.a.m.  2. H. pylori serology will be checked.  3. Further recommendations will be made after biopsy review.      ___________________________________________                                            Lionel December, M.D.   NR/MEDQ  D:  07/27/2003  T:  07/27/2003  Job:  [id number]

## 2011-04-17 LAB — GLUCOSE, CAPILLARY: Glucose-Capillary: 142 — ABNORMAL HIGH

## 2011-06-20 ENCOUNTER — Encounter (HOSPITAL_COMMUNITY): Payer: Self-pay | Admitting: Pharmacy Technician

## 2011-06-21 ENCOUNTER — Encounter (HOSPITAL_COMMUNITY)
Admission: RE | Admit: 2011-06-21 | Discharge: 2011-06-21 | Disposition: A | Discharge status: Home / Self Care | Payer: Medicare Other | Source: Ambulatory Visit | Attending: Ophthalmology | Admitting: Ophthalmology

## 2011-06-21 ENCOUNTER — Encounter (HOSPITAL_COMMUNITY): Payer: Self-pay

## 2011-06-21 ENCOUNTER — Encounter (HOSPITAL_COMMUNITY): Payer: Self-pay | Admitting: Pharmacy Technician

## 2011-06-21 ENCOUNTER — Other Ambulatory Visit: Payer: Self-pay

## 2011-06-21 HISTORY — DX: Unspecified osteoarthritis, unspecified site: M19.90

## 2011-06-21 HISTORY — DX: Malignant (primary) neoplasm, unspecified: C80.1

## 2011-06-21 HISTORY — DX: Other pulmonary embolism without acute cor pulmonale: I26.99

## 2011-06-21 HISTORY — DX: Hypothyroidism, unspecified: E03.9

## 2011-06-21 LAB — BASIC METABOLIC PANEL
BUN: 8 mg/dL (ref 6–23)
CO2: 31 mEq/L (ref 19–32)
Calcium: 9.5 mg/dL (ref 8.4–10.5)
GFR calc non Af Amer: 83 mL/min — ABNORMAL LOW (ref >90)
Glucose, Bld: 199 mg/dL — ABNORMAL HIGH (ref 70–99)
Sodium: 139 mEq/L (ref 135–145)

## 2011-06-21 LAB — CBC
HCT: 37.4 % (ref 36.0–46.0)
Hemoglobin: 12.8 g/dL (ref 12.0–15.0)
MCH: 31.3 pg (ref 26.0–34.0)
MCHC: 34.2 g/dL (ref 30.0–36.0)
MCV: 91.4 fL (ref 78.0–100.0)
RBC: 4.09 MIL/uL (ref 3.87–5.11)

## 2011-06-21 NOTE — Patient Instructions (Addendum)
22 Valerie Boyd  06/21/2011   Your procedure is scheduled on:  06/25/2011  Report to [facility] at  1000  AM.  Call this number if you have problems the morning of surgery: [phone]   Remember:   Do not eat food:After Midnight.  May have clear liquids:until Midnight .  Clear liquids include soda, tea, black coffee, apple or grape juice, broth.  Take these medicines the morning of surgery with A SIP OF WATER: levothyroxine   Do not wear jewelry, make-up or nail polish.  Do not wear lotions, powders, or perfumes. You may wear deodorant.  Do not shave 48 hours prior to surgery.  Do not bring valuables to the hospital.  Contacts, dentures or bridgework may not be worn into surgery.  Leave suitcase in the car. After surgery it may be brought to your room.  For patients admitted to the hospital, checkout time is 11:00 AM the day of discharge.   Patients discharged the day of surgery will not be allowed to drive home.  Name and phone number of your driver: family  Special Instructions: N/A   Please read over the following fact sheets that you were given: Pain Booklet, Surgical Site Infection Prevention, Anesthesia Post-op Instructions and Care and Recovery After Surgery Cataract A cataract is a clouding of the lens of the eye. It is most often related to aging. A cataract is not a "film" over the surface of the eye. The lens is inside the eye and changes size of the pupil. The lens can enlarge to let more light enter the eye in dark environments and contract the size of the pupil to let in bright light. The lens is the part of the eye that helps focus light on the retina. The retina is the eye's light-sensitive layer. It is in the back of the eye that sends visual signals to the brain. In a normal eye, light passes through the lens and gets focused on the retina. To help produce a sharp image, the lens must remain clear. When a lens becomes cloudy, vision is compromised by the degree and  nature of the clouding. Certain cataracts make people more near-sighted as they develop, others increase glare, and all reduce vision to some degree or another. A cataract that is so dense that it becomes milky white and a white opacity can be seen through the pupil. When the white color is seen, it is called a "mature" or "hyper-mature cataract." Such cataracts cause total blindness in the affected eye. The cataract must be removed to prevent damage to the eye itself. Some types of cataracts can cause a secondary disease of the eye, such as certain types of glaucoma. In the early stages, better lighting and eyeglasses may lessen vision problems caused by cataracts. At a certain point, surgery may be needed to improve vision. CAUSES   Aging. However, cataracts may occur at any age, even in newborns.   Certain drugs.   Trauma to the eye.   Certain diseases (such as diabetes).   Inherited or acquired medical syndromes.  SYMPTOMS   Gradual, progressive drop in vision in the affected eye. Cataracts may develop at different rates in each eye. Cataracts may even be in just one eye with the other unaffected.   Cataracts due to trauma may develop quickly, sometimes over a matter or days or even hours. The result is severe and rapid visual loss.  DIAGNOSIS  To detect a cataract, an eye doctor examines the lens.  A well developed cataract can be diagnosed without dilating the pupil. Early cataracts and others of a specific nature are best diagnosed with an exam of the eyes with the pupils dilated by drops. TREATMENT   For an early cataract, vision may improve by using different eyeglasses or stronger lighting.   If the above measures do not help, surgery is the only effective treatment. This treatment removes the cloudy lens and replaces it with a substitute lens (Intraocular lens, or IOL). Newly developed IOL technology allows the implanted lens to improve vision both at a distance and up close.  Discuss with your eye surgeon about the possibility of still needing glasses. Also discuss how visual coordination between both eyes will be affected.  A cataract needs to be removed only when vision loss interferes with your everyday activities such as driving, reading or watching TV. You and your eye doctor can make that decision together. In most cases, waiting until you are ready to have cataract surgery will not harm your eye. If you have cataracts in both eyes, only one should be removed at a time. This allows the operated eye to heal and be out of danger from serious problems (such as infection or poor wound healing) before having the other eye undergo surgery.  Sometimes, a cataract should be removed even if it does not cause problems with your vision. For example, a cataract should be removed if it prevents examination or treatment of another eye problem. Just as you cannot see out of the affected eye well, your doctor cannot see into your eye well through a cataract. The vast majority of people who have cataract surgery have better vision afterward. CATARACT REMOVAL There are two primary ways to remove a cataract. Your doctor can explain the differences and help determine which is best for you:  Phacoemulsification (small incision cataract surgery). This involves making a small cut (incision) on the edge of the clear, dome-shaped surface that covers the front of the eye (the cornea). An injection behind the eye or eye drops are given to make this a painless procedure. The doctor then inserts a tiny probe into the eye. This device emits ultrasound waves that soften and break up the cloudy center of the lens so it can be removed by suction. Most cataract surgery is done this way. The cuts are usually so small and performed in such a manner that often no sutures are needed to keep it closed.   Extracapsular surgery. Your doctor makes a slightly longer incision on the side of the cornea. The doctor  removes the hard center of the lens. The remainder of the lens is then removed by suction. In some cases, extremely fine sutures are needed which the doctor may, or may not remove in the office after the surgery.  When an IOL is implanted, it needs no care. It becomes a permanent part of your eye and cannot be seen or felt.  Some people cannot have an IOL. They may have problems during surgery, or maybe they have another eye disease. For these people, a soft contact lens may be suggested. If an IOL or contact lens cannot be used, very powerful and thick glasses are required after surgery. Since vision is very different through such thick glasses, it is important to have your doctor discuss the impact on your vision after any cataract surgery where there is no plan to implant an IOL. The normal lens of the eye is covered by a clear capsule. Both  phacoemulsification and extracapsular surgery require that the back surface of this lens capsule be left in place. This helps support IOLs and prevents the IOL from dislocating and falling back into the deeper interior of the eye. Right after surgery, and often permanently this "posterior capsule" remains clear. In some cases however, it can become cloudy, presenting the same type of visual compromise that the original cataract did since light is again obstructed as it passes through the clear IOL. This condition is often referred to as an "after-cataract." Fortunately, after-cataracts are easily treated using a painless and very fast laser treatment that is performed without anesthesia or incisions. It is done in a matter of minutes in an outpatient environment. Visual improvement is often immediate.  HOME CARE INSTRUCTIONS   Your surgeon will discuss pre and post operative care with you prior to surgery. The majority of people are able to do almost all normal activities right away. Although, it is often advised to avoid strenuous activity for a period of time.    Postoperative drops and careful avoidance of infection will be needed. Many surgeons suggest the use of a protective shield during the first few days after surgery.   There is a very small incidence of complication from modern cataract surgery, but it can happen. Infection that spreads to the inside of the eye (endophthalmitis) can result in total visual loss and even loss of the eye itself. In extremely rare instances, the inflammation of endophthalmitis can spread to both eyes (sympathetic ophthalmia). Appropriate post-operative care under the close observation of your surgeon is essential to a successful outcome.  SEEK IMMEDIATE MEDICAL CARE IF:   You have any sudden drop of vision in the operated eye.   You have pain in the operated eye.   You see a large number of floating dots in the field of vision in the operated eye.   You see flashing lights, or if a portion of your side vision in any direction appears black (like a curtain being drawn into your field of vision) in the operated eye.  Document Released: 07/02/2005 Document Revised: 03/14/2011 Document Reviewed: 08/18/2007 [facility] Patient Information 2012 [place], [place].PATIENT INSTRUCTIONS POST-ANESTHESIA  IMMEDIATELY FOLLOWING SURGERY:  Do not drive or operate machinery for the first twenty four hours after surgery.  Do not make any important decisions for twenty four hours after surgery or while taking narcotic pain medications or sedatives.  If you develop intractable nausea and vomiting or a severe headache please notify your doctor immediately.  FOLLOW-UP:  Please make an appointment with your surgeon as instructed. You do not need to follow up with anesthesia unless specifically instructed to do so.  WOUND CARE INSTRUCTIONS (if applicable):  Keep a dry clean dressing on the anesthesia/puncture wound site if there is drainage.  Once the wound has quit draining you may leave it open to air.  Generally you should leave the  bandage intact for twenty four hours unless there is drainage.  If the epidural site drains for more than 36-48 hours please call the anesthesia department.  QUESTIONS?:  Please feel free to call your physician or the hospital operator if you have any questions, and they will be happy to assist you.     [facility] Anesthesia Department [address] [place] [phone]

## 2011-06-25 ENCOUNTER — Encounter (HOSPITAL_COMMUNITY): Payer: Self-pay | Admitting: *Deleted

## 2011-06-25 ENCOUNTER — Encounter (HOSPITAL_COMMUNITY): Payer: Self-pay | Admitting: Anesthesiology

## 2011-06-25 ENCOUNTER — Ambulatory Visit (HOSPITAL_COMMUNITY)
Admission: RE | Admit: 2011-06-25 | Discharge: 2011-06-25 | Disposition: A | Discharge status: Home / Self Care | Payer: Medicare Other | Source: Ambulatory Visit | Attending: Ophthalmology | Admitting: Ophthalmology

## 2011-06-25 ENCOUNTER — Encounter (HOSPITAL_COMMUNITY)
Admission: RE | Disposition: A | Discharge status: Home / Self Care | Payer: Self-pay | Source: Ambulatory Visit | Attending: Ophthalmology

## 2011-06-25 DIAGNOSIS — Z01810 Encounter for preprocedural cardiovascular examination: Secondary | ICD-10-CM | POA: Insufficient documentation

## 2011-06-25 DIAGNOSIS — H52229 Regular astigmatism, unspecified eye: Secondary | ICD-10-CM | POA: Insufficient documentation

## 2011-06-25 DIAGNOSIS — H2510 Age-related nuclear cataract, unspecified eye: Secondary | ICD-10-CM | POA: Insufficient documentation

## 2011-06-25 DIAGNOSIS — Z01812 Encounter for preprocedural laboratory examination: Secondary | ICD-10-CM | POA: Insufficient documentation

## 2011-06-25 DIAGNOSIS — I10 Essential (primary) hypertension: Secondary | ICD-10-CM | POA: Insufficient documentation

## 2011-06-25 DIAGNOSIS — E119 Type 2 diabetes mellitus without complications: Secondary | ICD-10-CM | POA: Insufficient documentation

## 2011-06-25 DIAGNOSIS — Z79899 Other long term (current) drug therapy: Secondary | ICD-10-CM | POA: Insufficient documentation

## 2011-06-25 HISTORY — PX: CATARACT EXTRACTION W/PHACO: SHX586

## 2011-06-25 LAB — GLUCOSE, CAPILLARY: Glucose-Capillary: 164 mg/dL — ABNORMAL HIGH (ref 70–99)

## 2011-06-25 SURGERY — PHACOEMULSIFICATION, CATARACT, WITH IOL INSERTION
Anesthesia: Monitor Anesthesia Care | Site: Eye | Laterality: Left | Wound class: Clean

## 2011-06-25 MED ORDER — LIDOCAINE HCL (PF) 1 % IJ SOLN
INTRAMUSCULAR | Status: DC | PRN
  Administered 2011-06-25: .3 mL

## 2011-06-25 MED ORDER — EPINEPHRINE HCL 1 MG/ML IJ SOLN
INTRAMUSCULAR | Status: AC
  Filled 2011-06-25: qty 1

## 2011-06-25 MED ORDER — LIDOCAINE 3.5 % OP GEL OPTIME - NO CHARGE
OPHTHALMIC | Status: DC | PRN
  Administered 2011-06-25: 1 [drp] via OPHTHALMIC

## 2011-06-25 MED ORDER — PHENYLEPHRINE HCL 2.5 % OP SOLN
1.0000 [drp] | OPHTHALMIC | Status: AC
  Administered 2011-06-25 (×3): 1 [drp] via OPHTHALMIC

## 2011-06-25 MED ORDER — LIDOCAINE HCL 3.5 % OP GEL
1.0000 "application " | Freq: Once | OPHTHALMIC | Status: AC
  Administered 2011-06-25: 1 via OPHTHALMIC

## 2011-06-25 MED ORDER — LIDOCAINE HCL (PF) 1 % IJ SOLN
INTRAMUSCULAR | Status: AC
  Filled 2011-06-25: qty 2

## 2011-06-25 MED ORDER — CYCLOPENTOLATE-PHENYLEPHRINE 0.2-1 % OP SOLN
OPHTHALMIC | Status: AC
  Filled 2011-06-25: qty 2

## 2011-06-25 MED ORDER — MIDAZOLAM HCL 2 MG/2ML IJ SOLN
1.0000 mg | INTRAMUSCULAR | Status: DC | PRN
  Administered 2011-06-25 (×2): 1 mg via INTRAVENOUS

## 2011-06-25 MED ORDER — LIDOCAINE HCL 3.5 % OP GEL
OPHTHALMIC | Status: AC
  Filled 2011-06-25: qty 5

## 2011-06-25 MED ORDER — MIDAZOLAM HCL 2 MG/2ML IJ SOLN
INTRAMUSCULAR | Status: AC
  Filled 2011-06-25: qty 2

## 2011-06-25 MED ORDER — LACTATED RINGERS IV SOLN
INTRAVENOUS | Status: DC
  Administered 2011-06-25: 11:00:00 via INTRAVENOUS

## 2011-06-25 MED ORDER — CYCLOPENTOLATE-PHENYLEPHRINE 0.2-1 % OP SOLN
1.0000 [drp] | OPHTHALMIC | Status: AC
  Administered 2011-06-25 (×3): 1 [drp] via OPHTHALMIC

## 2011-06-25 MED ORDER — TETRACAINE HCL 0.5 % OP SOLN
1.0000 [drp] | OPHTHALMIC | Status: AC
  Administered 2011-06-25 (×3): 1 [drp] via OPHTHALMIC

## 2011-06-25 MED ORDER — EPINEPHRINE HCL 1 MG/ML IJ SOLN
INTRAMUSCULAR | Status: DC | PRN
  Administered 2011-06-25: 12:00:00

## 2011-06-25 MED ORDER — NEOMYCIN-POLYMYXIN-DEXAMETH 3.5-10000-0.1 OP OINT
TOPICAL_OINTMENT | OPHTHALMIC | Status: AC
  Filled 2011-06-25: qty 3.5

## 2011-06-25 MED ORDER — PROVISC 10 MG/ML IO SOLN
INTRAOCULAR | Status: DC | PRN
  Administered 2011-06-25: 8.5 mg via INTRAOCULAR

## 2011-06-25 MED ORDER — POVIDONE-IODINE 5 % OP SOLN
OPHTHALMIC | Status: DC | PRN
  Administered 2011-06-25: 1 via OPHTHALMIC

## 2011-06-25 MED ORDER — NEOMYCIN-POLYMYXIN-DEXAMETH 0.1 % OP OINT
TOPICAL_OINTMENT | OPHTHALMIC | Status: DC | PRN
  Administered 2011-06-25: 1 via OPHTHALMIC

## 2011-06-25 MED ORDER — BSS IO SOLN
INTRAOCULAR | Status: DC | PRN
  Administered 2011-06-25: 15 mL via INTRAOCULAR

## 2011-06-25 MED ORDER — TETRACAINE HCL 0.5 % OP SOLN
OPHTHALMIC | Status: AC
  Filled 2011-06-25: qty 2

## 2011-06-25 MED ORDER — PHENYLEPHRINE HCL 2.5 % OP SOLN
OPHTHALMIC | Status: AC
  Filled 2011-06-25: qty 2

## 2011-06-25 SURGICAL SUPPLY — 32 items
CAPSULAR TENSION RING-AMO (OPHTHALMIC RELATED) IMPLANT
CLOTH BEACON ORANGE TIMEOUT ST (SAFETY) ×2 IMPLANT
EYE SHIELD UNIVERSAL CLEAR (GAUZE/BANDAGES/DRESSINGS) ×2 IMPLANT
GLOVE BIO SURGEON STRL SZ 6.5 (GLOVE) IMPLANT
GLOVE BIOGEL PI IND STRL 6.5 (GLOVE) ×2 IMPLANT
GLOVE BIOGEL PI IND STRL 7.0 (GLOVE) IMPLANT
GLOVE BIOGEL PI IND STRL 7.5 (GLOVE) IMPLANT
GLOVE BIOGEL PI INDICATOR 6.5 (GLOVE) ×2
GLOVE BIOGEL PI INDICATOR 7.0 (GLOVE)
GLOVE BIOGEL PI INDICATOR 7.5 (GLOVE)
GLOVE ECLIPSE 6.5 STRL STRAW (GLOVE) IMPLANT
GLOVE ECLIPSE 7.0 STRL STRAW (GLOVE) IMPLANT
GLOVE ECLIPSE 7.5 STRL STRAW (GLOVE) IMPLANT
GLOVE EXAM NITRILE LRG STRL (GLOVE) IMPLANT
GLOVE EXAM NITRILE MD LF STRL (GLOVE) ×2 IMPLANT
GLOVE SKINSENSE NS SZ6.5 (GLOVE)
GLOVE SKINSENSE NS SZ7.0 (GLOVE)
GLOVE SKINSENSE STRL SZ6.5 (GLOVE) IMPLANT
GLOVE SKINSENSE STRL SZ7.0 (GLOVE) IMPLANT
KIT VITRECTOMY (OPHTHALMIC RELATED) IMPLANT
LENS SIGHTPATH STAAR TORIC ×2 IMPLANT
PAD ARMBOARD 7.5X6 YLW CONV (MISCELLANEOUS) ×2 IMPLANT
PROC W NO LENS (INTRAOCULAR LENS)
PROC W SPEC LENS (INTRAOCULAR LENS) ×2
PROCESS W NO LENS (INTRAOCULAR LENS) IMPLANT
PROCESS W SPEC LENS (INTRAOCULAR LENS) ×1 IMPLANT
RING MALYGIN (MISCELLANEOUS) IMPLANT
SYR TB 1ML LL NO SAFETY (SYRINGE) ×4 IMPLANT
TAPE SURG TRANSPORE 1 IN (GAUZE/BANDAGES/DRESSINGS) ×1 IMPLANT
TAPE SURGICAL TRANSPORE 1 IN (GAUZE/BANDAGES/DRESSINGS) ×1
VISCOELASTIC ADDITIONAL (OPHTHALMIC RELATED) IMPLANT
WATER STERILE IRR 250ML POUR (IV SOLUTION) ×2 IMPLANT

## 2011-06-25 NOTE — Anesthesia Preprocedure Evaluation (Addendum)
Anesthesia Evaluation  Patient identified by MRN, date of birth, ID band Patient awake    Reviewed: Allergy & Precautions, H&P , NPO status , Patient's Chart, lab work & pertinent test results  History of Anesthesia Complications Negative for: history of anesthetic complications  Airway Mallampati: II      Dental  (+) Teeth Intact   Pulmonary  Hx pulm embolus 1985 clear to auscultation        Cardiovascular hypertension, Pt. on medications Regular Normal    Neuro/Psych    GI/Hepatic   Endo/Other  Diabetes mellitus-, Well Controlled, Type 2, Oral Hypoglycemic AgentsHypothyroidism   Renal/GU      Musculoskeletal   Abdominal   Peds  Hematology   Anesthesia Other Findings   Reproductive/Obstetrics                           Anesthesia Physical Anesthesia Plan  ASA: III  Anesthesia Plan: MAC   Post-op Pain Management:    Induction: Intravenous  Airway Management Planned: Nasal Cannula  Additional Equipment:   Intra-op Plan:   Post-operative Plan:   Informed Consent: I have reviewed the patients History and Physical, chart, labs and discussed the procedure including the risks, benefits and alternatives for the proposed anesthesia with the patient or authorized representative who has indicated his/her understanding and acceptance.     Plan Discussed with:   Anesthesia Plan Comments:         Anesthesia Quick Evaluation

## 2011-06-25 NOTE — Brief Op Note (Signed)
Pre-Op Dx: Cataract OS, astigmatism Post-Op Dx: Cataract OS, astigmatism Surgeon: Gemma Payor Anesthesia: Topical with MAC Implant: STAAR Toric 22.0SE/+2.0 @ 14/194 Specimen: None Complications: None

## 2011-06-25 NOTE — H&P (Signed)
I have reviewed the H&P, the patient was re-examined, and I have identified no interval changes in medical condition and plan of care since the history and physical of record  

## 2011-06-25 NOTE — Transfer of Care (Signed)
Immediate Anesthesia Transfer of Care Note  Patient: Valerie Boyd  Procedure(s) Performed:  CATARACT EXTRACTION PHACO AND INTRAOCULAR LENS PLACEMENT (IOC) - CDE: 24.21  Patient Location: PACU and Short Stay  Anesthesia Type: MAC  Level of Consciousness: awake  Airway & Oxygen Therapy: Patient Spontanous Breathing  Post-op Assessment: Report given to PACU RN  Post vital signs: Reviewed and stable  Complications: No apparent anesthesia complications

## 2011-06-25 NOTE — Anesthesia Postprocedure Evaluation (Signed)
  Anesthesia Post-op Note  Patient: Valerie Boyd  Procedure(s) Performed:  CATARACT EXTRACTION PHACO AND INTRAOCULAR LENS PLACEMENT (IOC) - CDE: 24.21  Patient Location: Short Stay  Anesthesia Type: MAC  Level of Consciousness: awake, alert  and oriented  Airway and Oxygen Therapy: Patient Spontanous Breathing  Post-op Pain: none  Post-op Assessment: Post-op Vital signs reviewed, Patient's Cardiovascular Status Stable, Respiratory Function Stable and No signs of Nausea or vomiting  Post-op Vital Signs: Reviewed and stable  Complications: No apparent anesthesia complications

## 2011-06-26 NOTE — Op Note (Signed)
NAMEARIEL, DIMITRI               ACCOUNT NO.:  [id number]  MEDICAL RECORD NO.:  [id number]  LOCATION:  [facility]PO                          FACILITY:  [facility]  PHYSICIAN:  Susanne Greenhouse, MD       DATE OF BIRTH:  30-Oct-1934  DATE OF PROCEDURE:  06/25/2011 DATE OF DISCHARGE:  06/25/2011                              OPERATIVE REPORT   PREOPERATIVE DIAGNOSIS:  Nuclear cataract, left eye, diagnosis code 366.16, and stigmatism, left eye, diagnosis code 367.21.  POSTOPERATIVE DIAGNOSIS:  Nuclear cataract, left eye, diagnosis code 366.16, and stigmatism, left eye, diagnosis code 367.21.  OPERATION PERFORMED:  Phacoemulsification with posterior chamber intraocular lens implantation, left eye.  SURGEON:  Susanne Greenhouse, MD  ANESTHESIA:  General endotracheal anesthesia.  OPERATIVE SUMMARY:  In the preoperative area, dilating drops were placed into the left eye.  The patient was then brought into the operating room where she was placed under general anesthesia.  The eye was then prepped and draped.  Beginning with a 75 blade, a paracentesis port was made at the surgeon's 2 o'clock position.  The anterior chamber was then filled with a 1% nonpreserved lidocaine solution with epinephrine.  This was followed by Viscoat to deepen the chamber.  A small fornix-based peritomy was performed superiorly.  Next, a single iris hook was placed through the limbus superiorly.  A 2.4-mm keratome blade was then used to make a clear corneal incision over the iris hook.  A bent cystotome needle and Utrata forceps were used to create a continuous tear capsulotomy.  Hydrodissection was performed using balanced salt solution on a fine cannula.  The lens nucleus was then removed using phacoemulsification in a quadrant cracking technique.  The cortical material was then removed with irrigation and aspiration.  The capsular bag and anterior chamber were refilled with Provisc.  The wound was widened to approximately 3 mm and  a posterior chamber intraocular lens was placed into the capsular bag without difficulty using an [employer] lens injecting system.  A single 10-0 nylon suture was then used to close the incision as well as stromal hydration.  The Provisc was removed from the anterior chamber and capsular bag with irrigation and aspiration.  At this point, the wounds were tested for leak, which were negative.  The anterior chamber remained deep and stable.  The patient tolerated the procedure well.  There were no operative complications, and she awoke from general anesthesia without problem.  Toric intraocular lens was placed at the 14 and 194 degree meridians.  There were no surgical specimens.  Prosthetic device used is a STAAR Toric posterior chamber lens, model [id number], power is 22.0 spherical equivalent with a +2 toric add, serial number is [id number].          ______________________________ Susanne Greenhouse, MD     KEH/MEDQ  D:  06/25/2011  T:  06/26/2011  Job:  [id number]

## 2011-07-02 ENCOUNTER — Encounter (HOSPITAL_COMMUNITY): Payer: Self-pay | Admitting: Ophthalmology

## 2011-10-11 ENCOUNTER — Encounter (HOSPITAL_COMMUNITY): Payer: Self-pay

## 2011-10-11 ENCOUNTER — Encounter (HOSPITAL_COMMUNITY)
Admission: RE | Admit: 2011-10-11 | Discharge: 2011-10-11 | Disposition: A | Discharge status: Home / Self Care | Payer: Medicare Other | Source: Ambulatory Visit | Attending: Ophthalmology | Admitting: Ophthalmology

## 2011-10-11 LAB — BASIC METABOLIC PANEL
BUN: 9 mg/dL (ref 6–23)
CO2: 27 mEq/L (ref 19–32)
Chloride: 104 mEq/L (ref 96–112)
Glucose, Bld: 210 mg/dL — ABNORMAL HIGH (ref 70–99)
Potassium: 4 mEq/L (ref 3.5–5.1)
Sodium: 138 mEq/L (ref 135–145)

## 2011-10-11 NOTE — Patient Instructions (Signed)
20 Valerie Boyd  10/11/2011   Your procedure is scheduled on:  Thursday , 10/18/11  Report to [facility] at [phone] AM.  Call this number if you have problems the morning of surgery: [phone]   Remember:   Do not eat food:After Midnight.  May have clear liquids:until Midnight .  Clear liquids include soda, tea, black coffee, apple or grape juice, broth.  Take these medicines the morning of surgery with A SIP OF WATER: levothyroxine   Do not wear jewelry, make-up or nail polish.  Do not wear lotions, powders, or perfumes. You may wear deodorant.  Do not shave 48 hours prior to surgery.  Do not bring valuables to the hospital.  Contacts, dentures or bridgework may not be worn into surgery.  Leave suitcase in the car. After surgery it may be brought to your room.  For patients admitted to the hospital, checkout time is 11:00 AM the day of discharge.   Patients discharged the day of surgery will not be allowed to drive home.  Name and phone number of your driver: driver  Special Instructions: driver   Please read over the following fact sheets that you were given: Pain Booklet, Anesthesia Post-op Instructions and Care and Recovery After Surgery   PATIENT INSTRUCTIONS POST-ANESTHESIA  IMMEDIATELY FOLLOWING SURGERY:  Do not drive or operate machinery for the first twenty four hours after surgery.  Do not make any important decisions for twenty four hours after surgery or while taking narcotic pain medications or sedatives.  If you develop intractable nausea and vomiting or a severe headache please notify your doctor immediately.  FOLLOW-UP:  Please make an appointment with your surgeon as instructed. You do not need to follow up with anesthesia unless specifically instructed to do so.  WOUND CARE INSTRUCTIONS (if applicable):  Keep a dry clean dressing on the anesthesia/puncture wound site if there is drainage.  Once the wound has quit draining you may leave it open to air.  Generally you  should leave the bandage intact for twenty four hours unless there is drainage.  If the epidural site drains for more than 36-48 hours please call the anesthesia department.  QUESTIONS?:  Please feel free to call your physician or the hospital operator if you have any questions, and they will be happy to assist you.        Cataract A cataract is a clouding of the lens of the eye. When a lens becomes cloudy, vision is reduced based on the degree and nature of the clouding. Many cataracts reduce vision to some degree. Some cataracts make people more near-sighted as they develop. Other cataracts increase glare. Cataracts that are ignored and become worse can sometimes look white. The white color can be seen through the pupil. CAUSES   Aging. However, cataracts may occur at any age, even in newborns.   Certain drugs.   Trauma to the eye.   Certain diseases such as diabetes.   Specific eye diseases such as chronic inflammation inside the eye or a sudden attack of a rare form of glaucoma.   Inherited or acquired medical problems.  SYMPTOMS   Gradual, progressive drop in vision in the affected eye.   Severe, rapid visual loss. This most often happens when trauma is the cause.  DIAGNOSIS  To detect a cataract, an eye doctor examines the lens. Cataracts are best diagnosed with an exam of the eyes with the pupils enlarged (dilated) by drops.  TREATMENT  For an early cataract,  vision may improve by using different eyeglasses or stronger lighting. If that does not help your vision, surgery is the only effective treatment. A cataract needs to be surgically removed when vision loss interferes with your everyday activities, such as driving, reading, or watching TV. A cataract may also have to be removed if it prevents examination or treatment of another eye problem. Surgery removes the cloudy lens and usually replaces it with a substitute lens (intraocular lens, IOL).  At a time when both you and  your doctor agree, the cataract will be surgically removed. If you have cataracts in both eyes, only one is usually removed at a time. This allows the operated eye to heal and be out of danger from any possible problems after surgery (such as infection or poor wound healing). In rare cases, a cataract may be doing damage to your eye. In these cases, your caregiver may advise surgical removal right away. The vast majority of people who have cataract surgery have better vision afterward. HOME CARE INSTRUCTIONS  If you are not planning surgery, you may be asked to do the following:  Use different eyeglasses.   Use stronger or brighter lighting.   Ask your eye doctor about reducing your medicine dose or changing medicines if it is thought that a medicine caused your cataract. Changing medicines does not make the cataract go away on its own.   Become familiar with your surroundings. Poor vision can lead to injury. Avoid bumping into things on the affected side. You are at a higher risk for tripping or falling.   Exercise extreme care when driving or operating machinery.   Wear sunglasses if you are sensitive to bright light or experiencing problems with glare.  SEEK IMMEDIATE MEDICAL CARE IF:   You have a worsening or sudden vision loss.   You notice redness, swelling, or increasing pain in the eye.   You have a fever.  Document Released: 07/02/2005 Document Revised: 06/21/2011 Document Reviewed: 02/23/2011 [facility] Patient Information 2012 [place], [place].

## 2011-10-17 MED ORDER — LIDOCAINE HCL (PF) 1 % IJ SOLN
INTRAMUSCULAR | Status: AC
  Filled 2011-10-17: qty 2

## 2011-10-17 MED ORDER — TETRACAINE HCL 0.5 % OP SOLN
OPHTHALMIC | Status: AC
  Administered 2011-10-18: 1 [drp] via OPHTHALMIC
  Filled 2011-10-17: qty 2

## 2011-10-17 MED ORDER — CYCLOPENTOLATE-PHENYLEPHRINE 0.2-1 % OP SOLN
OPHTHALMIC | Status: AC
  Administered 2011-10-18: 1 [drp] via OPHTHALMIC
  Filled 2011-10-17: qty 2

## 2011-10-17 MED ORDER — NEOMYCIN-POLYMYXIN-DEXAMETH 3.5-10000-0.1 OP OINT
TOPICAL_OINTMENT | OPHTHALMIC | Status: AC
  Filled 2011-10-17: qty 3.5

## 2011-10-17 MED ORDER — LIDOCAINE HCL 3.5 % OP GEL
OPHTHALMIC | Status: AC
  Administered 2011-10-18: 1 via OPHTHALMIC
  Filled 2011-10-17: qty 5

## 2011-10-18 ENCOUNTER — Ambulatory Visit (HOSPITAL_COMMUNITY): Payer: Medicare Other | Admitting: Anesthesiology

## 2011-10-18 ENCOUNTER — Encounter (HOSPITAL_COMMUNITY): Payer: Self-pay | Admitting: Ophthalmology

## 2011-10-18 ENCOUNTER — Ambulatory Visit (HOSPITAL_COMMUNITY)
Admission: RE | Admit: 2011-10-18 | Discharge: 2011-10-18 | Disposition: A | Discharge status: Home / Self Care | Payer: Medicare Other | Source: Ambulatory Visit | Attending: Ophthalmology | Admitting: Ophthalmology

## 2011-10-18 ENCOUNTER — Encounter (HOSPITAL_COMMUNITY): Payer: Self-pay | Admitting: Anesthesiology

## 2011-10-18 ENCOUNTER — Encounter (HOSPITAL_COMMUNITY)
Admission: RE | Disposition: A | Discharge status: Home / Self Care | Payer: Self-pay | Source: Ambulatory Visit | Attending: Ophthalmology

## 2011-10-18 DIAGNOSIS — Z01812 Encounter for preprocedural laboratory examination: Secondary | ICD-10-CM | POA: Insufficient documentation

## 2011-10-18 DIAGNOSIS — H2510 Age-related nuclear cataract, unspecified eye: Secondary | ICD-10-CM | POA: Insufficient documentation

## 2011-10-18 DIAGNOSIS — Z79899 Other long term (current) drug therapy: Secondary | ICD-10-CM | POA: Insufficient documentation

## 2011-10-18 DIAGNOSIS — E119 Type 2 diabetes mellitus without complications: Secondary | ICD-10-CM | POA: Insufficient documentation

## 2011-10-18 DIAGNOSIS — I10 Essential (primary) hypertension: Secondary | ICD-10-CM | POA: Insufficient documentation

## 2011-10-18 HISTORY — PX: CATARACT EXTRACTION W/PHACO: SHX586

## 2011-10-18 SURGERY — PHACOEMULSIFICATION, CATARACT, WITH IOL INSERTION
Anesthesia: Monitor Anesthesia Care | Site: Eye | Laterality: Right | Wound class: Clean

## 2011-10-18 MED ORDER — CYCLOPENTOLATE-PHENYLEPHRINE 0.2-1 % OP SOLN
1.0000 [drp] | OPHTHALMIC | Status: AC
  Administered 2011-10-18 (×3): 1 [drp] via OPHTHALMIC

## 2011-10-18 MED ORDER — ONDANSETRON HCL 4 MG/2ML IJ SOLN: 4.0000 mg | Freq: Once | INTRAMUSCULAR | Status: DC | PRN

## 2011-10-18 MED ORDER — PROVISC 10 MG/ML IO SOLN
INTRAOCULAR | Status: DC | PRN
  Administered 2011-10-18: 8.5 mg via INTRAOCULAR

## 2011-10-18 MED ORDER — MIDAZOLAM HCL 2 MG/2ML IJ SOLN
INTRAMUSCULAR | Status: AC
  Filled 2011-10-18: qty 2

## 2011-10-18 MED ORDER — NEOMYCIN-POLYMYXIN-DEXAMETH 0.1 % OP OINT
TOPICAL_OINTMENT | OPHTHALMIC | Status: DC | PRN
  Administered 2011-10-18: 1 via OPHTHALMIC

## 2011-10-18 MED ORDER — FENTANYL CITRATE 0.05 MG/ML IJ SOLN: 25.0000 ug | INTRAMUSCULAR | Status: DC | PRN

## 2011-10-18 MED ORDER — PHENYLEPHRINE HCL 2.5 % OP SOLN
1.0000 [drp] | OPHTHALMIC | Status: AC
  Administered 2011-10-18 (×3): 1 [drp] via OPHTHALMIC

## 2011-10-18 MED ORDER — EPINEPHRINE HCL 1 MG/ML IJ SOLN
INTRAMUSCULAR | Status: AC
  Filled 2011-10-18: qty 1

## 2011-10-18 MED ORDER — TETRACAINE HCL 0.5 % OP SOLN
1.0000 [drp] | OPHTHALMIC | Status: AC
  Administered 2011-10-18 (×3): 1 [drp] via OPHTHALMIC

## 2011-10-18 MED ORDER — LACTATED RINGERS IV SOLN
INTRAVENOUS | Status: DC
  Administered 2011-10-18: 08:00:00 via INTRAVENOUS

## 2011-10-18 MED ORDER — POVIDONE-IODINE 5 % OP SOLN
OPHTHALMIC | Status: DC | PRN
  Administered 2011-10-18: 1 via OPHTHALMIC

## 2011-10-18 MED ORDER — BSS IO SOLN
INTRAOCULAR | Status: DC | PRN
  Administered 2011-10-18: 15 mL via INTRAOCULAR

## 2011-10-18 MED ORDER — MIDAZOLAM HCL 2 MG/2ML IJ SOLN
1.0000 mg | INTRAMUSCULAR | Status: DC | PRN
  Administered 2011-10-18: 2 mg via INTRAVENOUS

## 2011-10-18 MED ORDER — LIDOCAINE HCL 3.5 % OP GEL
1.0000 "application " | Freq: Once | OPHTHALMIC | Status: AC
  Administered 2011-10-18: 1 via OPHTHALMIC

## 2011-10-18 MED ORDER — LIDOCAINE HCL (PF) 1 % IJ SOLN
INTRAOCULAR | Status: DC | PRN
  Administered 2011-10-18: 08:00:00 via OPHTHALMIC

## 2011-10-18 MED ORDER — PHENYLEPHRINE HCL 2.5 % OP SOLN
OPHTHALMIC | Status: AC
  Administered 2011-10-18: 1 [drp] via OPHTHALMIC
  Filled 2011-10-18: qty 2

## 2011-10-18 MED ORDER — LIDOCAINE 3.5 % OP GEL OPTIME - NO CHARGE
OPHTHALMIC | Status: DC | PRN
  Administered 2011-10-18: 1 [drp] via OPHTHALMIC

## 2011-10-18 MED ORDER — EPINEPHRINE HCL 1 MG/ML IJ SOLN
INTRAOCULAR | Status: DC | PRN
  Administered 2011-10-18: 08:00:00

## 2011-10-18 SURGICAL SUPPLY — 32 items
CAPSULAR TENSION RING-AMO (OPHTHALMIC RELATED) IMPLANT
CLOTH BEACON ORANGE TIMEOUT ST (SAFETY) ×2 IMPLANT
EYE SHIELD UNIVERSAL CLEAR (GAUZE/BANDAGES/DRESSINGS) ×4 IMPLANT
GLOVE BIO SURGEON STRL SZ 6.5 (GLOVE) IMPLANT
GLOVE BIOGEL PI IND STRL 6.5 (GLOVE) ×2 IMPLANT
GLOVE BIOGEL PI IND STRL 7.0 (GLOVE) IMPLANT
GLOVE BIOGEL PI IND STRL 7.5 (GLOVE) IMPLANT
GLOVE BIOGEL PI INDICATOR 6.5 (GLOVE) ×2
GLOVE BIOGEL PI INDICATOR 7.0 (GLOVE)
GLOVE BIOGEL PI INDICATOR 7.5 (GLOVE)
GLOVE ECLIPSE 6.5 STRL STRAW (GLOVE) IMPLANT
GLOVE ECLIPSE 7.0 STRL STRAW (GLOVE) IMPLANT
GLOVE ECLIPSE 7.5 STRL STRAW (GLOVE) IMPLANT
GLOVE EXAM NITRILE LRG STRL (GLOVE) IMPLANT
GLOVE EXAM NITRILE MD LF STRL (GLOVE) ×2 IMPLANT
GLOVE SKINSENSE NS SZ6.5 (GLOVE)
GLOVE SKINSENSE NS SZ7.0 (GLOVE)
GLOVE SKINSENSE STRL SZ6.5 (GLOVE) IMPLANT
GLOVE SKINSENSE STRL SZ7.0 (GLOVE) IMPLANT
KIT VITRECTOMY (OPHTHALMIC RELATED) IMPLANT
PAD ARMBOARD 7.5X6 YLW CONV (MISCELLANEOUS) ×2 IMPLANT
PROC W NO LENS (INTRAOCULAR LENS)
PROC W SPEC LENS (INTRAOCULAR LENS)
PROCESS W NO LENS (INTRAOCULAR LENS) IMPLANT
PROCESS W SPEC LENS (INTRAOCULAR LENS) IMPLANT
RING MALYGIN (MISCELLANEOUS) IMPLANT
SIGHTPATH CAT PROC W REG LENS (Ophthalmic Related) ×2 IMPLANT
SYR TB 1ML LL NO SAFETY (SYRINGE) ×2 IMPLANT
TAPE SURG TRANSPORE 1 IN (GAUZE/BANDAGES/DRESSINGS) ×1 IMPLANT
TAPE SURGICAL TRANSPORE 1 IN (GAUZE/BANDAGES/DRESSINGS) ×1
VISCOELASTIC ADDITIONAL (OPHTHALMIC RELATED) IMPLANT
WATER STERILE IRR 250ML POUR (IV SOLUTION) ×2 IMPLANT

## 2011-10-18 NOTE — Anesthesia Preprocedure Evaluation (Signed)
Anesthesia Evaluation  Patient identified by MRN, date of birth, ID band Patient awake    Reviewed: Allergy & Precautions, H&P , NPO status , Patient's Chart, lab work & pertinent test results  History of Anesthesia Complications Negative for: history of anesthetic complications  Airway Mallampati: II      Dental  (+) Teeth Intact   Pulmonary  Hx pulm embolus 1985 breath sounds clear to auscultation        Cardiovascular hypertension, Pt. on medications Rhythm:Regular Rate:Normal     Neuro/Psych    GI/Hepatic   Endo/Other  Diabetes mellitus-, Well Controlled, Type 2, Oral Hypoglycemic AgentsHypothyroidism   Renal/GU      Musculoskeletal   Abdominal   Peds  Hematology   Anesthesia Other Findings   Reproductive/Obstetrics                           Anesthesia Physical Anesthesia Plan  ASA: III  Anesthesia Plan: MAC   Post-op Pain Management:    Induction: Intravenous  Airway Management Planned: Nasal Cannula  Additional Equipment:   Intra-op Plan:   Post-operative Plan:   Informed Consent: I have reviewed the patients History and Physical, chart, labs and discussed the procedure including the risks, benefits and alternatives for the proposed anesthesia with the patient or authorized representative who has indicated his/her understanding and acceptance.     Plan Discussed with:   Anesthesia Plan Comments:         Anesthesia Quick Evaluation

## 2011-10-18 NOTE — Discharge Instructions (Signed)
Valerie Boyd  10/18/2011     Instructions  1. Use medications as Instructed.  Shake well before use. Wait 5 minutes between drops.  {OPHTHALMIC ANTIBIOTICS:22167} 4 times a day x 1 week.  {OPHTHALMIC ANTI-INFLAMMATORY:22168} 2 times a day x 4 weeks.  {OPHTHALMIC STEROID:22169} 4 times a day - week 1   3 times a day - Week 2, 2 times a day- Week 3, 1 time a day - Week 4.  2. Do not rub the operative eye. Do not swim underwater for 2 weeks.  3. You may remove the clear shield and resume your normal activities the day after  Surgery. Your eyes may feel more comfortable if you wear dark glasses outside.  4. Call our office at [phone] if you have sudden change in vision, extreme redness or pain. Some fluctuation in vision is normal after surgery. If you have an emergency after hours, call Dr. Alto Denver at [phone].  5. It is important that you attend all of your follow-up appointments.        Follow-up:{follow up:32580} with Gemma Payor, MD.   Dr. Lahoma Crocker: [phone]  Dr. Lita Mains: [phone]  Dr. Alto Denver: [phone]   If you find that you cannot contact your physician, but feel that your signs and   Symptoms warrant a physician's attention, call the Emergency Room at   [phone].   Other{NA AND WUXLKGMW:10272}.

## 2011-10-18 NOTE — H&P (Signed)
I have reviewed the H&P, the patient was re-examined, and I have identified no interval changes in medical condition and plan of care since the history and physical of record  

## 2011-10-18 NOTE — Anesthesia Postprocedure Evaluation (Signed)
  Anesthesia Post-op Note  Patient: Valerie Boyd  Procedure(s) Performed: Procedure(s) (LRB): CATARACT EXTRACTION PHACO AND INTRAOCULAR LENS PLACEMENT (IOC) (Right)  Patient Location: PACU and Short Stay  Anesthesia Type: MAC  Level of Consciousness: awake, alert  and oriented  Airway and Oxygen Therapy: Patient Spontanous Breathing  Post-op Pain: none  Post-op Assessment: Post-op Vital signs reviewed, Patient's Cardiovascular Status Stable, Respiratory Function Stable, Patent Airway and No signs of Nausea or vomiting  Post-op Vital Signs: Reviewed  Complications: No apparent anesthesia complications

## 2011-10-18 NOTE — Brief Op Note (Signed)
Pre-Op Dx: Cataract OD Post-Op Dx: Cataract OD Surgeon: Blayklee Mable Anesthesia: Topical with MAC Implant: Lenstec, Model Softec HD Blood Loss: None Specimen: None Complications: None 

## 2011-10-18 NOTE — Transfer of Care (Signed)
Immediate Anesthesia Transfer of Care Note  Patient: Valerie Boyd  Procedure(s) Performed: Procedure(s) (LRB): CATARACT EXTRACTION PHACO AND INTRAOCULAR LENS PLACEMENT (IOC) (Right)  Patient Location: PACU and Short Stay  Anesthesia Type: MAC  Level of Consciousness: awake, alert  and oriented  Airway & Oxygen Therapy: Patient Spontanous Breathing  Post-op Assessment: Report given to PACU RN  Post vital signs: Reviewed and stable  Complications: No apparent anesthesia complications

## 2011-10-18 NOTE — Op Note (Signed)
NAMEDEVYNNE, Valerie Boyd               ACCOUNT NO.:  [id number]  MEDICAL RECORD NO.:  [id number]  LOCATION:  [facility]                          FACILITY:  [facility]  PHYSICIAN:  Susanne Greenhouse, MD       DATE OF BIRTH:  [birth year]  DATE OF PROCEDURE:  10/18/2011 DATE OF DISCHARGE:  10/18/2011                              OPERATIVE REPORT   PREOPERATIVE DIAGNOSIS:  Nuclear cataract, right eye, diagnosis code 366.16.  POSTOPERATIVE DIAGNOSIS:  Nuclear cataract, right eye, diagnosis code 366.16.  OPERATION PERFORMED:  Phacoemulsification with posterior chamber intraocular lens implantation, right eye.  SURGEON:  Bonne Dolores. Collie Wernick, MD  ANESTHESIA:  General endotracheal anesthesia.  OPERATIVE SUMMARY:  In the preoperative area, dilating drops were placed into the right eye.  The patient was then brought into the operating room where she was placed under general anesthesia.  The eye was then prepped and draped.  Beginning with a 75 blade, a paracentesis port was made at the surgeon's 2 o'clock position.  The anterior chamber was then filled with a 1% nonpreserved lidocaine solution with epinephrine.  This was followed by Viscoat to deepen the chamber.  A small fornix-based peritomy was performed superiorly.  Next, a single iris hook was placed through the limbus superiorly.  A 2.4-mm keratome blade was then used to make a clear corneal incision over the iris hook.  A bent cystotome needle and Utrata forceps were used to create a continuous tear capsulotomy.  Hydrodissection was performed using balanced salt solution on a fine cannula.  The lens nucleus was then removed using phacoemulsification in a quadrant cracking technique.  The cortical material was then removed with irrigation and aspiration.  The capsular bag and anterior chamber were refilled with Provisc.  The wound was widened to approximately 3 mm and a posterior chamber intraocular lens was placed into the capsular bag without difficulty  using an [employer] lens injecting system.  A single 10-0 nylon suture was then used to close the incision as well as stromal hydration.  The Provisc was removed from the anterior chamber and capsular bag with irrigation and aspiration.  At this point, the wounds were tested for leak, which were negative.  The anterior chamber remained deep and stable.  The patient tolerated the procedure well.  There were no operative complications, and she awoke from general anesthesia without problem.  No surgical specimens.  Prosthetic device used is a Lenstec posterior chamber lens, model Softec HD, power of 22.0, serial number is [id number].          ______________________________ Susanne Greenhouse, MD     KEH/MEDQ  D:  10/18/2011  T:  10/18/2011  Job:  [id number]

## 2011-10-22 ENCOUNTER — Encounter (HOSPITAL_COMMUNITY): Payer: Self-pay | Admitting: Ophthalmology

## 2011-12-20 ENCOUNTER — Encounter (INDEPENDENT_AMBULATORY_CARE_PROVIDER_SITE_OTHER): Payer: Self-pay | Admitting: *Deleted

## 2012-01-08 ENCOUNTER — Ambulatory Visit (INDEPENDENT_AMBULATORY_CARE_PROVIDER_SITE_OTHER): Payer: Medicare Other | Admitting: Internal Medicine

## 2012-01-30 ENCOUNTER — Encounter: Payer: Medicare Other | Admitting: Internal Medicine

## 2012-02-11 ENCOUNTER — Ambulatory Visit (INDEPENDENT_AMBULATORY_CARE_PROVIDER_SITE_OTHER): Payer: Medicare Other | Admitting: Internal Medicine

## 2012-04-23 ENCOUNTER — Encounter (INDEPENDENT_AMBULATORY_CARE_PROVIDER_SITE_OTHER): Payer: Self-pay | Admitting: *Deleted

## 2012-06-17 DIAGNOSIS — Z23 Encounter for immunization: Secondary | ICD-10-CM

## 2012-06-17 DIAGNOSIS — E119 Type 2 diabetes mellitus without complications: Secondary | ICD-10-CM

## 2012-06-17 DIAGNOSIS — C189 Malignant neoplasm of colon, unspecified: Secondary | ICD-10-CM

## 2012-06-17 DIAGNOSIS — E039 Hypothyroidism, unspecified: Secondary | ICD-10-CM

## 2012-11-06 ENCOUNTER — Telehealth (INDEPENDENT_AMBULATORY_CARE_PROVIDER_SITE_OTHER): Payer: Self-pay | Admitting: *Deleted

## 2012-11-06 ENCOUNTER — Ambulatory Visit (INDEPENDENT_AMBULATORY_CARE_PROVIDER_SITE_OTHER): Payer: Medicare Other | Admitting: Internal Medicine

## 2012-11-06 ENCOUNTER — Other Ambulatory Visit (INDEPENDENT_AMBULATORY_CARE_PROVIDER_SITE_OTHER): Payer: Self-pay | Admitting: *Deleted

## 2012-11-06 ENCOUNTER — Encounter (INDEPENDENT_AMBULATORY_CARE_PROVIDER_SITE_OTHER): Payer: Self-pay | Admitting: Internal Medicine

## 2012-11-06 VITALS — BP 130/70 | HR 78 | Temp 97.7°F | Resp 18 | Ht 65.0 in | Wt 177.4 lb

## 2012-11-06 DIAGNOSIS — Z85038 Personal history of other malignant neoplasm of large intestine: Secondary | ICD-10-CM

## 2012-11-06 DIAGNOSIS — Z1211 Encounter for screening for malignant neoplasm of colon: Secondary | ICD-10-CM

## 2012-11-06 DIAGNOSIS — K766 Portal hypertension: Secondary | ICD-10-CM

## 2012-11-06 NOTE — Progress Notes (Signed)
Presenting complaint;  Followup for portal hypertension and history of colon carcinoma. Lower abdominal pain.  History of present illness;  Patient is 78 year old Caucasian female who is referred through courtesy of Dr. Erby Pian for GI evaluation. She is well-known to me from previous evaluations but has not been seen since May 2010. She presents with lower abdominal pain. This pain started and she had viral infection 3 weeks ago with diarrhea and nausea. Her diarrhea lasted for one week. She did not experience melena or rectal bleeding. Her abdominal pain has resolved and so his diarrhea she complains of excessive flatulence. She does not recall that she had fever during this illness. She has very good appetite. She has gained 20 pounds this year. She is not having any problems with lower extremity edema. She has heartburn only with certain foods which is not often. She has not been walking on account of bad weather. She was seen by Dr. Cleone Slim back in December 2013 had blood work which was normal. She was reminded she needed colonoscopy.   Current Medications: Current Outpatient Prescriptions  Medication Sig Dispense Refill  . fish oil-omega-3 fatty acids 1000 MG capsule Take 2 g by mouth daily.      Marland Kitchen glimepiride (AMARYL) 4 MG tablet Take 2 mg by mouth daily.        Marland Kitchen levothyroxine (SYNTHROID, LEVOTHROID) 25 MCG tablet Take 25 mcg by mouth daily.        . [DISCONTINUED] omega-3 acid ethyl esters (LOVAZA) 1 G capsule Take 2 g by mouth daily.         No current facility-administered medications for this visit.   Past medical history;  History of goiter diagnosed 18 years ago. She had 2 FNAs.      She is now on replacement therapy. She had laparoscopic cholecystectomy in 1981 for      symptomatic cholelithiasis History of portal hypertension diagnosed in 2005 and CT        revealed splenomegaly and perisplenic varices. EGD in       January 2005 was negative for varices. Workup for chronic liver  disease was negative. She was advised transjugular liver biopsy and hepatic wedge pressure 8 2005 but she declined. She finally had liver biopsy at the time of surgery in August 2009 revealing mild steatosis but no evidence of cirrhosis. She was diagnosed with colon carcinoma and she presented with iron deficiency anemia in 2009. She had right, colectomy for T3, N0, M0 disease and she has remained in remission. Last colonoscopy was in October 2010 and no abnormality was found. Diabetes mellitus was diagnosed 7 or 8 years ago. Bilateral cataract extraction in 2012. Vitamin D deficiency was diagnosed in April 2013. Currently on no therapy.  Allergies; Allergies  Allergen Reactions  . Adhesive (Tape) Rash   Family history; Father had CAD and left to be 91. Mother died of brain cancer within 6 months of diagnosis at age 47. Patient has no siblings. Social history; She is married and has 2 daughters living. One daughter died of congenital heart disease at 78 months of age. She is retired [employer] fundraiser. She has never smoked cigarettes or drank alcohol.  Objective: Blood pressure 130/70, pulse 78, temperature 97.7 F (36.5 C), temperature source Oral, resp. rate 18, height 5\' 5"  (1.651 m), weight 177 lb 6.4 oz (80.468 kg). Patient is alert and in no acute distress. Conjunctiva is pink. Sclera is nonicteric Oropharyngeal mucosa is normal. No neck masses or thyromegaly noted. Cardiac exam with regular  rhythm normal S1 and S2. No murmur or gallop noted. Lungs are clear to auscultation. Abdomen is full. Bowel sounds are normal. Abdomen is soft. Spleen tip is easily palpable. Liver edge is indistinct but no RCM.  No LE edema or clubbing noted.  Labs/studies Results: Ultrasound from Nov 28, 2011. Mild fatty liver, splenomegaly and perisplenic varices. Abdominopelvic CT from 12/14/2011. Study performed with contrast. 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 Postoperative  changes to right colon. Trace free fluid in the pelvis.  Assessment:  #1. History of colon carcinoma. Status post right hemicolectomy in August 2009 and she remains in remission. Last colonoscopy was over 3 years ago and she is due for one now. #2. Portal hypertension. She was initially discovered to have splenomegaly, splenic varices with spontaneous splenorenal shunt 10 years ago. Liver biopsy at the time of colon surgery was negative for cirrhosis. She may have pre-hepatic portal hypertension. I do not believe she needs further evaluation at this time. She needs to make sure that her diabetes is well controlled and she needs to try to lose 20 pounds that she gained last year. #3. Recent bout of diarrhea possibly food borne illness.   Recommendations;  Surveillance colonoscopy. Request blood work from December 2013 that she had at [facility]. Probiotic 1 capsule by mouth daily. Patient also advised to take calcium and vitamin D(500/200) one tablet twice daily.

## 2012-11-06 NOTE — Patient Instructions (Signed)
Probiotic 1 capsule by mouth daily. Colonoscopy to be scheduled.

## 2012-11-06 NOTE — Telephone Encounter (Signed)
Patient needs movi prep 

## 2012-11-07 ENCOUNTER — Encounter (HOSPITAL_COMMUNITY): Payer: Self-pay | Admitting: Pharmacy Technician

## 2012-11-07 MED ORDER — PEG-KCL-NACL-NASULF-NA ASC-C 100 G PO SOLR: 1.0000 | Freq: Once | ORAL | Status: DC

## 2012-11-20 ENCOUNTER — Encounter (INDEPENDENT_AMBULATORY_CARE_PROVIDER_SITE_OTHER): Payer: Self-pay

## 2012-11-21 ENCOUNTER — Encounter (HOSPITAL_COMMUNITY)
Admission: RE | Disposition: A | Discharge status: Home / Self Care | Payer: Self-pay | Source: Ambulatory Visit | Attending: Internal Medicine

## 2012-11-21 ENCOUNTER — Ambulatory Visit (HOSPITAL_COMMUNITY)
Admission: RE | Admit: 2012-11-21 | Discharge: 2012-11-21 | Disposition: A | Discharge status: Home / Self Care | Payer: Medicare Other | Source: Ambulatory Visit | Attending: Internal Medicine | Admitting: Internal Medicine

## 2012-11-21 ENCOUNTER — Encounter (INDEPENDENT_AMBULATORY_CARE_PROVIDER_SITE_OTHER): Payer: Self-pay

## 2012-11-21 ENCOUNTER — Encounter (HOSPITAL_COMMUNITY): Payer: Self-pay | Admitting: *Deleted

## 2012-11-21 DIAGNOSIS — D126 Benign neoplasm of colon, unspecified: Secondary | ICD-10-CM

## 2012-11-21 DIAGNOSIS — Z01812 Encounter for preprocedural laboratory examination: Secondary | ICD-10-CM | POA: Insufficient documentation

## 2012-11-21 DIAGNOSIS — E119 Type 2 diabetes mellitus without complications: Secondary | ICD-10-CM | POA: Insufficient documentation

## 2012-11-21 DIAGNOSIS — K5289 Other specified noninfective gastroenteritis and colitis: Secondary | ICD-10-CM | POA: Insufficient documentation

## 2012-11-21 DIAGNOSIS — Z85038 Personal history of other malignant neoplasm of large intestine: Secondary | ICD-10-CM

## 2012-11-21 HISTORY — PX: COLONOSCOPY: SHX5424

## 2012-11-21 SURGERY — COLONOSCOPY
Anesthesia: Moderate Sedation

## 2012-11-21 MED ORDER — MIDAZOLAM HCL 5 MG/5ML IJ SOLN
INTRAMUSCULAR | Status: DC | PRN
  Administered 2012-11-21 (×2): 2 mg via INTRAVENOUS
  Administered 2012-11-21: 1 mg via INTRAVENOUS

## 2012-11-21 MED ORDER — STERILE WATER FOR IRRIGATION IR SOLN
Status: DC | PRN
  Administered 2012-11-21: 10:00:00

## 2012-11-21 MED ORDER — MEPERIDINE HCL 50 MG/ML IJ SOLN
INTRAMUSCULAR | Status: DC | PRN
  Administered 2012-11-21: 25 mg via INTRAVENOUS

## 2012-11-21 MED ORDER — MIDAZOLAM HCL 5 MG/5ML IJ SOLN
INTRAMUSCULAR | Status: AC
  Filled 2012-11-21: qty 10

## 2012-11-21 MED ORDER — MEPERIDINE HCL 50 MG/ML IJ SOLN
INTRAMUSCULAR | Status: AC
  Filled 2012-11-21: qty 1

## 2012-11-21 MED ORDER — SODIUM CHLORIDE 0.9 % IV SOLN: INTRAVENOUS | Status: DC

## 2012-11-21 NOTE — Op Note (Signed)
COLONOSCOPY PROCEDURE REPORT  PATIENT:  Valerie Boyd  MR#:  [id number] Birthdate:  May 15, 1935, 78 y.o., female Endoscopist:  Dr. Malissa Hippo, MD Referred By:  Dr. Ignatius Specking, MD Procedure Date: 11/21/2012  Procedure:   Colonoscopy  Indications: Patient is  78 year old Caucasian female with history of colon carcinoma status post right hemicolectomy in August 2009 and whose last colonoscopy was in October 2010. She is here for surveillance colonoscopy. She had blood work in December 20 13th. Her CEA was 2.1. Her LFTs were normal. H&H was 12.1 and 35.1 WBC of 2.9 and platelet count of 103K.  Informed Consent:  The procedure and risks were reviewed with the patient and informed consent was obtained.  Medications:  Demerol 25 mg IV Versed 5 mg IV  Description of procedure:  After a digital rectal exam was performed, that colonoscope was advanced from the anus through the rectum and colon to hepatic flexure where the ileocolonic anastomosis was identified. Pictures were taken with high phone since system was malfunctioning From the level of anastomosis, the scope was slowly and cautiously withdrawn. The mucosal surfaces were carefully surveyed utilizing scope tip to flexion to facilitate fold flattening as needed. The scope was pulled down into the rectum where a thorough exam including retroflexion was performed.  Findings:   Prep excellent. Focal mucosal edema with erosion with suture in the middle. 4 mm polyp ablated via cold biopsy from mid sigmoid colon. Normal rectal mucosa and anal rectal junction.   Therapeutic/Diagnostic Maneuvers Performed:  See above  Complications:  None  Coln Withdrawal Time:  20 minutes  Impression:  Normal mucosa of neoterminal ileum. Wide open ileocolonic anastomosis located in the region of hepatic flexure. Focal colitis at anastomosis felt to be suture related. 4 mm polyp ablated via cold biopsy from mid sigmoid colon.  Recommendations:   Standard instructions given. I will contact patient with biopsy results and further recommendations. Next colonoscopy in 3 years.  [name]  11/21/2012 10:27 AM  CC: Dr. Ignatius Specking., MD & Dr. Bonnetta Barry ref. provider found CC; Dr. Earma Reading, MD

## 2012-11-21 NOTE — H&P (Addendum)
Valerie Boyd is an 78 y.o. female.   Chief Complaint: Patient is here for colonoscopy. HPI: Patient is a 78 year old Caucasian female with history of colon carcinoma. She is status post right hemicolectomy in August 2009. She has remained in remission. Her last colonoscopy was in October 2010 and she denies abdominal pain melena or rectal bleeding.  Past Medical History  Diagnosis Date  . Diabetes mellitus   . Hypothyroidism   . Pulmonary embolism     with gallbladder surg in 81-[place]  . Cancer 4 yrs ago    colon cancer-ascending colon-[facility]  . Arthritis     Past Surgical History  Procedure Laterality Date  . Cholecystectomy  1981    [place]  . Colon surgery  2009    [facility]_ascending colon cancer  . Hip surgery  1985    russell traction for cracked hip from [facility]-right  . Cataract extraction w/phaco  06/25/2011    Procedure: CATARACT EXTRACTION PHACO AND INTRAOCULAR LENS PLACEMENT (IOC);  Surgeon: Gemma Payor;  Location: [facility];  Service: Ophthalmology;  Laterality: Left;  Dr. Alto Denver brought lens from his office; CDE: 24.21  . Cataract extraction w/phaco  10/18/2011    Procedure: CATARACT EXTRACTION PHACO AND INTRAOCULAR LENS PLACEMENT (IOC);  Surgeon: Gemma Payor, MD;  Location: [facility];  Service: Ophthalmology;  Laterality: Right;  CDE:23.43    Family History  Problem Relation Age of Onset  . Anesthesia problems Neg Hx   . Hypotension Neg Hx   . Malignant hyperthermia Neg Hx   . Pseudochol deficiency Neg Hx   . Colon polyps Neg Hx   . Colon cancer Other    Social History:  reports that she has never smoked. She has never used smokeless tobacco. She reports that she does not drink alcohol or use illicit drugs.  Allergies:  Allergies  Allergen Reactions  . Adhesive (Tape) Rash    Medications Prior to Admission  Medication Sig Dispense Refill  . calcium-vitamin D (OSCAL WITH D) 500-200 MG-UNIT per tablet Take 1 tablet by mouth daily.      . fish oil-omega-3 fatty  acids 1000 MG capsule Take 2 g by mouth daily.      Marland Kitchen glimepiride (AMARYL) 4 MG tablet Take 2 mg by mouth daily.       Marland Kitchen levothyroxine (SYNTHROID, LEVOTHROID) 25 MCG tablet Take 25 mcg by mouth daily.        . peg 3350 powder (MOVIPREP) 100 G SOLR Take 1 kit (100 g total) by mouth once.  1 kit  0    Results for orders placed during the hospital encounter of 11/21/12 (from the past 48 hour(s))  GLUCOSE, CAPILLARY     Status: Abnormal   Collection Time    11/21/12  9:07 AM      Result Value Range   Glucose-Capillary 154 (*) 70 - 99 mg/dL   No results found.  ROS  Blood pressure 149/64, temperature 97.8 F (36.6 C), temperature source Oral, resp. rate 22, height 5\' 5"  (1.651 m), weight 170 lb (77.111 kg), SpO2 98.00%. Physical Exam  Constitutional: She appears well-developed and well-nourished.  HENT:  Mouth/Throat: Oropharynx is clear and moist.  Eyes: Conjunctivae are normal. No scleral icterus.  Neck: No thyromegaly present.  Cardiovascular: Normal rate, regular rhythm and normal heart sounds.   No murmur heard. Respiratory: Effort normal and breath sounds normal.  GI: Soft. She exhibits no distension and no mass. There is no tenderness.  Musculoskeletal: She exhibits no edema.  Lymphadenopathy:  She has no cervical adenopathy.  Neurological: She is alert.  Skin: Skin is warm and dry.     Assessment/Plan History of colon carcinoma. Surveillance colonoscopy.  REHMAN,NAJEEB U 11/21/2012, 9:49 AM

## 2012-11-24 ENCOUNTER — Encounter (INDEPENDENT_AMBULATORY_CARE_PROVIDER_SITE_OTHER): Payer: Self-pay | Admitting: *Deleted

## 2012-11-25 ENCOUNTER — Encounter (HOSPITAL_COMMUNITY): Payer: Self-pay | Admitting: Internal Medicine

## 2013-10-26 ENCOUNTER — Encounter (INDEPENDENT_AMBULATORY_CARE_PROVIDER_SITE_OTHER): Payer: Self-pay

## 2015-01-18 ENCOUNTER — Emergency Department (HOSPITAL_COMMUNITY): Payer: Medicare HMO

## 2015-01-18 ENCOUNTER — Encounter (HOSPITAL_COMMUNITY): Payer: Self-pay | Admitting: Emergency Medicine

## 2015-01-18 ENCOUNTER — Emergency Department (HOSPITAL_COMMUNITY)
Admission: EM | Admit: 2015-01-18 | Discharge: 2015-01-18 | Disposition: A | Discharge status: Home / Self Care | Payer: Medicare HMO | Attending: Emergency Medicine | Admitting: Emergency Medicine

## 2015-01-18 DIAGNOSIS — I10 Essential (primary) hypertension: Secondary | ICD-10-CM | POA: Insufficient documentation

## 2015-01-18 DIAGNOSIS — Z86711 Personal history of pulmonary embolism: Secondary | ICD-10-CM | POA: Diagnosis not present

## 2015-01-18 DIAGNOSIS — Z85038 Personal history of other malignant neoplasm of large intestine: Secondary | ICD-10-CM | POA: Insufficient documentation

## 2015-01-18 DIAGNOSIS — E119 Type 2 diabetes mellitus without complications: Secondary | ICD-10-CM | POA: Diagnosis not present

## 2015-01-18 DIAGNOSIS — R519 Headache, unspecified: Secondary | ICD-10-CM

## 2015-01-18 DIAGNOSIS — Z8673 Personal history of transient ischemic attack (TIA), and cerebral infarction without residual deficits: Secondary | ICD-10-CM | POA: Insufficient documentation

## 2015-01-18 DIAGNOSIS — E039 Hypothyroidism, unspecified: Secondary | ICD-10-CM | POA: Diagnosis not present

## 2015-01-18 DIAGNOSIS — Z79899 Other long term (current) drug therapy: Secondary | ICD-10-CM | POA: Diagnosis not present

## 2015-01-18 DIAGNOSIS — Z8739 Personal history of other diseases of the musculoskeletal system and connective tissue: Secondary | ICD-10-CM | POA: Diagnosis not present

## 2015-01-18 DIAGNOSIS — R531 Weakness: Secondary | ICD-10-CM | POA: Diagnosis not present

## 2015-01-18 DIAGNOSIS — R51 Headache: Secondary | ICD-10-CM | POA: Insufficient documentation

## 2015-01-18 HISTORY — DX: Cerebral infarction, unspecified: I63.9

## 2015-01-18 HISTORY — DX: Essential (primary) hypertension: I10

## 2015-01-18 LAB — COMPREHENSIVE METABOLIC PANEL
ALT: 16 U/L (ref 14–54)
AST: 22 U/L (ref 15–41)
Albumin: 3.8 g/dL (ref 3.5–5.0)
Alkaline Phosphatase: 88 U/L (ref 38–126)
Anion gap: 7 (ref 5–15)
BUN: 7 mg/dL (ref 6–20)
CALCIUM: 9 mg/dL (ref 8.9–10.3)
CO2: 25 mmol/L (ref 22–32)
Chloride: 107 mmol/L (ref 101–111)
Creatinine, Ser: 0.79 mg/dL (ref 0.44–1.00)
GFR calc Af Amer: >60 mL/min (ref >60)
GLUCOSE: 220 mg/dL — AB (ref 65–99)
Potassium: 3.6 mmol/L (ref 3.5–5.1)
Sodium: 139 mmol/L (ref 135–145)
TOTAL PROTEIN: 7.2 g/dL (ref 6.5–8.1)
Total Bilirubin: 2.3 mg/dL — ABNORMAL HIGH (ref 0.3–1.2)

## 2015-01-18 LAB — APTT: APTT: 29 s (ref 24–37)

## 2015-01-18 LAB — URINALYSIS, ROUTINE W REFLEX MICROSCOPIC
BILIRUBIN URINE: NEGATIVE
Glucose, UA: NEGATIVE mg/dL
Hgb urine dipstick: NEGATIVE
Ketones, ur: NEGATIVE mg/dL
NITRITE: NEGATIVE
PROTEIN: NEGATIVE mg/dL
Specific Gravity, Urine: 1.007 (ref 1.005–1.030)
Urobilinogen, UA: 1 mg/dL (ref 0.0–1.0)
pH: 7 (ref 5.0–8.0)

## 2015-01-18 LAB — SEDIMENTATION RATE: Sed Rate: 21 mm/hr (ref 0–22)

## 2015-01-18 LAB — I-STAT CHEM 8, ED
BUN: 7 mg/dL (ref 6–20)
CALCIUM ION: 1.12 mmol/L — AB (ref 1.13–1.30)
CREATININE: 0.7 mg/dL (ref 0.44–1.00)
Chloride: 105 mmol/L (ref 101–111)
Glucose, Bld: 221 mg/dL — ABNORMAL HIGH (ref 65–99)
HCT: 38 % (ref 36.0–46.0)
HEMOGLOBIN: 12.9 g/dL (ref 12.0–15.0)
Potassium: 3.7 mmol/L (ref 3.5–5.1)
SODIUM: 141 mmol/L (ref 135–145)
TCO2: 22 mmol/L (ref 0–100)

## 2015-01-18 LAB — I-STAT TROPONIN, ED: TROPONIN I, POC: 0.01 ng/mL (ref 0.00–0.08)

## 2015-01-18 LAB — CBC
HEMATOCRIT: 36.9 % (ref 36.0–46.0)
Hemoglobin: 12.9 g/dL (ref 12.0–15.0)
MCH: 32.9 pg (ref 26.0–34.0)
MCHC: 35 g/dL (ref 30.0–36.0)
MCV: 94.1 fL (ref 78.0–100.0)
Platelets: 99 10*3/uL — ABNORMAL LOW (ref 150–400)
RBC: 3.92 MIL/uL (ref 3.87–5.11)
RDW: 12.7 % (ref 11.5–15.5)
WBC: 3.5 10*3/uL — AB (ref 4.0–10.5)

## 2015-01-18 LAB — PROTIME-INR
INR: 1.11 (ref 0.00–1.49)
PROTHROMBIN TIME: 14.4 s (ref 11.6–15.2)

## 2015-01-18 LAB — URINE MICROSCOPIC-ADD ON

## 2015-01-18 LAB — DIFFERENTIAL
Basophils Absolute: 0 10*3/uL (ref 0.0–0.1)
Basophils Relative: 0 % (ref 0–1)
EOS PCT: 2 % (ref 0–5)
Eosinophils Absolute: 0.1 10*3/uL (ref 0.0–0.7)
LYMPHS ABS: 1 10*3/uL (ref 0.7–4.0)
LYMPHS PCT: 29 % (ref 12–46)
MONOS PCT: 5 % (ref 3–12)
Monocytes Absolute: 0.2 10*3/uL (ref 0.1–1.0)
NEUTROS PCT: 63 % (ref 43–77)
Neutro Abs: 2.2 10*3/uL (ref 1.7–7.7)

## 2015-01-18 MED ORDER — GABAPENTIN 100 MG PO CAPS
100.0000 mg | ORAL_CAPSULE | Freq: Two times a day (BID) | ORAL | Status: DC
Start: 2015-01-18 — End: 2015-01-20

## 2015-01-18 NOTE — Discharge Instructions (Signed)

## 2015-01-18 NOTE — ED Notes (Signed)
CT called for transport to CT, states pt will be transported to cT.

## 2015-01-18 NOTE — ED Notes (Signed)
Pt from home for eval of intermittent HA X1 week and right arm weakness for the same time. Pt with stroke on 6/22 with no deficits but reports seeing "black spots" in right eye since last stroke. No neuro deficits noted by this RN. nad noted. Axo x4.

## 2015-01-18 NOTE — ED Provider Notes (Signed)
CSN: [id number]     Arrival date & time 01/18/15  1623 History   First MD Initiated Contact with Patient 01/18/15 1658     Chief Complaint  Patient presents with  . Headache  . Extremity Weakness     (Consider location/radiation/quality/duration/timing/severity/associated sxs/prior Treatment) HPI Comments: Patient went to the ER on June 23 with a headache. She had an MRI done that showed she had a small hemorrhagic focus in the right parietal region. She also had a basilar infarct that was not acute. The hemorrhagic focus was not deemed to be acute also. Family reported she has progressively gotten worse with persistent headaches, dizziness, and generalized weakness over the past 2 weeks since her diagnosis. She has had right-sided vision changes also. An MRA was refused by her insurance provider. Family noted that over the past 2 days her speech is gotten slower. No fevers, shortness of breath, difficulty with urinating, abdominal pain.  I spoke with her PCP who reports she came in the day after her MRI reporting black spots in her vision, bilateral arm weakness, headache. She has persistently complained of headaches since the MRI. Her PCP recommended seeing a Neurologist for her headache.  Patient is a 80 y.o. female presenting with headaches and extremity weakness. The history is provided by the patient.  Headache Pain location:  R parietal Quality:  Dull (pressure) Radiates to:  Does not radiate Onset quality:  Gradual Duration:  2 weeks Timing:  Intermittent Progression:  Unchanged Chronicity:  New Similar to prior headaches: no   Context comment:  Spontaneously Relieved by:  Nothing Worsened by:  Nothing Associated symptoms: visual change (seeing black spots in R eye)   Associated symptoms: no cough, no diarrhea, no fatigue and no fever   Extremity Weakness Associated symptoms include headaches.    Past Medical History  Diagnosis Date  . Diabetes mellitus   .  Hypothyroidism   . Pulmonary embolism     with gallbladder surg in 83-[place]  . Cancer 4 yrs ago    colon cancer-ascending colon-[facility]  . Arthritis   . Stroke   . Hypertension    Past Surgical History  Procedure Laterality Date  . Cholecystectomy  1981    [place]  . Colon surgery  2009    [facility]_ascending [facility] cancer  . Hip surgery  1985    russell traction for cracked hip from [facility]  . Cataract extraction w/phaco  06/25/2011    Procedure: CATARACT EXTRACTION PHACO AND INTRAOCULAR LENS PLACEMENT (IOC);  Surgeon: Tonny Branch;  Location: [facility];  Service: Ophthalmology;  Laterality: Left;  Dr. Geoffry Paradise brought lens from his office; CDE: 24.21  . Cataract extraction w/phaco  10/18/2011    Procedure: CATARACT EXTRACTION PHACO AND INTRAOCULAR LENS PLACEMENT (IOC);  Surgeon: Tonny Branch, MD;  Location: [facility];  Service: Ophthalmology;  Laterality: Right;  CDE:23.43  . Colonoscopy N/A 11/21/2012    Procedure: COLONOSCOPY;  Surgeon: Rogene Houston, MD;  Location: [facility];  Service: Endoscopy;  Laterality: N/A;  930   Family History  Problem Relation Age of Onset  . Anesthesia problems Neg Hx   . Hypotension Neg Hx   . Malignant hyperthermia Neg Hx   . Pseudochol deficiency Neg Hx   . Colon polyps Neg Hx   . Colon cancer Other    History  Substance Use Topics  . Smoking status: Never Smoker   . Smokeless tobacco: Never Used  . Alcohol Use: No   OB History  No data available     Review of Systems  Constitutional: Negative for fever and fatigue.  Respiratory: Negative for cough.   Gastrointestinal: Negative for diarrhea.  Musculoskeletal: Positive for extremity weakness.  Neurological: Positive for headaches.  All other systems reviewed and are negative.     Allergies  Adhesive  Home Medications   Prior to Admission medications   Medication Sig Start Date End Date Taking? Authorizing Provider  calcium-vitamin D (OSCAL WITH D) 500-200 MG-UNIT per tablet  Take 1 tablet by mouth daily.    Historical Provider, MD  fish oil-omega-3 fatty acids 1000 MG capsule Take 2 g by mouth daily.    Historical Provider, MD  glimepiride (AMARYL) 4 MG tablet Take 2 mg by mouth daily.     Historical Provider, MD  levothyroxine (SYNTHROID, LEVOTHROID) 25 MCG tablet Take 25 mcg by mouth daily.      Historical Provider, MD   BP 173/58 mmHg  Pulse 100  Temp(Src) 98 F (36.7 C) (Oral)  Resp 14  Ht $R'5\' 3"'fG$  (1.6 m)  Wt 176 lb 4.8 oz (79.969 kg)  BMI 31.24 kg/m2  SpO2 100% Physical Exam  Constitutional: She is oriented to person, place, and time. She appears well-developed and well-nourished. No distress.  HENT:  Head: Normocephalic and atraumatic.  Mouth/Throat: Oropharynx is clear and moist.  Eyes: EOM are normal. Pupils are equal, round, and reactive to light.  Neck: Normal range of motion. Neck supple.  Cardiovascular: Normal rate and regular rhythm.  Exam reveals no friction rub.   No murmur heard. Pulmonary/Chest: Effort normal and breath sounds normal. No respiratory distress. She has no wheezes. She has no rales.  Abdominal: Soft. She exhibits no distension. There is no tenderness. There is no rebound.  Musculoskeletal: Normal range of motion. She exhibits no edema.  Neurological: She is alert and oriented to person, place, and time. No cranial nerve deficit or sensory deficit. She exhibits normal muscle tone. GCS eye subscore is 4. GCS verbal subscore is 5. GCS motor subscore is 6.  Skin: She is not diaphoretic.  Nursing note and vitals reviewed.   ED Course  Procedures (including critical care time) Labs Review Labs Reviewed  CBC - Abnormal; Notable for the following:    WBC 3.5 (*)    Platelets 99 (*)    All other components within normal limits  COMPREHENSIVE METABOLIC PANEL - Abnormal; Notable for the following:    Glucose, Bld 220 (*)    Total Bilirubin 2.3 (*)    All other components within normal limits  URINALYSIS, ROUTINE W REFLEX  MICROSCOPIC (NOT AT [facility]) - Abnormal; Notable for the following:    Leukocytes, UA TRACE (*)    All other components within normal limits  URINE MICROSCOPIC-ADD ON - Abnormal; Notable for the following:    Squamous Epithelial / LPF FEW (*)    All other components within normal limits  I-STAT CHEM 8, ED - Abnormal; Notable for the following:    Glucose, Bld 221 (*)    Calcium, Ion 1.12 (*)    All other components within normal limits  PROTIME-INR  APTT  DIFFERENTIAL  SEDIMENTATION RATE  I-STAT TROPOININ, ED  CBG MONITORING, ED    Imaging Review Ct Head Wo Contrast  01/18/2015   CLINICAL DATA:  80 year old female. Intermittent headache for 1 week. Right arm weakness.  EXAM: CT HEAD WITHOUT CONTRAST  TECHNIQUE: Contiguous axial images were obtained from the base of the skull through the vertex without intravenous contrast.  COMPARISON:  Brain CT 09/24/2014; MRI 01/06/2015  FINDINGS: Ventricles and sulci are appropriate for patient's age. Periventricular and subcortical white matter hypodensity compatible with chronic small vessel ischemic changes. No evidence for acute cortically based infarct, intracranial hemorrhage, mass lesion or mass-effect. Orbits are unremarkable. Paranasal sinuses are well aerated. Mastoid air cells are well aerated. Calvarium is intact.  IMPRESSION: No acute intracranial process.  Chronic small vessel ischemic changes.   Electronically Signed   By: Lovey Newcomer M.D.   On: 01/18/2015 17:32     EKG Interpretation None      MDM   Final diagnoses:  Right-sided headache    29F here with headaches for 2 weeks since diagnosed with a tiny hemorrhagic focus in the right parietal area and a stroke that is remote in the left basal ganglia. She states she her headaches are intermittent throughout the day, but nothing compared to the headache she had on the 23rd when she was initially diagnosed with the strokes. Patient also is reporting some right eye black spots. These  disappear when she covers her right eye and looks out of her left. It is only monocular, so I believe is likely a problem within the eye and not in her brain. Here she is neurologically intact. She does not have any aphasia, weakness, ataxia. She had a recent MR with old stroke and after that had a workup by her PCP including carotid Dopplers, aggressive blood pressure management. Her head CT here is normal. With a normal neuro exam here, I do not feel she needs an MRI. The headache she describes as right-sided and extends across her face and feels like tingling. I wonder if this is possible trigeminal neuralgia. Will speak with neurology to see if they have any recommendations.  I spoke with Dr. Nicole Kindred with Neuro who recommended ESR to look for possible GCA of the R eye. He fell her face tingling sensation could be secondary to her prior left basal ganglia infarct. He recommended Neurontin 100 mg twice a day to see if we'll help her symptoms.  ESR 21, normal. No temporal tenderness. Doubt GCA. Urine clean.  Stable for discharge, given neuro f/u, Neurontin 100 mg BID Rx written.  Evelina Bucy, MD 01/19/15 [phone]

## 2015-01-20 ENCOUNTER — Ambulatory Visit (INDEPENDENT_AMBULATORY_CARE_PROVIDER_SITE_OTHER): Payer: Medicare HMO | Admitting: Neurology

## 2015-01-20 ENCOUNTER — Other Ambulatory Visit (INDEPENDENT_AMBULATORY_CARE_PROVIDER_SITE_OTHER): Payer: Medicare HMO

## 2015-01-20 ENCOUNTER — Encounter: Payer: Self-pay | Admitting: Neurology

## 2015-01-20 VITALS — BP 130/66 | HR 84 | Resp 20 | Ht 65.0 in | Wt 175.6 lb

## 2015-01-20 DIAGNOSIS — R531 Weakness: Secondary | ICD-10-CM

## 2015-01-20 DIAGNOSIS — E538 Deficiency of other specified B group vitamins: Secondary | ICD-10-CM | POA: Diagnosis not present

## 2015-01-20 DIAGNOSIS — R51 Headache: Secondary | ICD-10-CM | POA: Diagnosis not present

## 2015-01-20 DIAGNOSIS — H539 Unspecified visual disturbance: Secondary | ICD-10-CM

## 2015-01-20 DIAGNOSIS — R519 Headache, unspecified: Secondary | ICD-10-CM

## 2015-01-20 LAB — BASIC METABOLIC PANEL
BUN: 9 mg/dL (ref 6–23)
CHLORIDE: 105 meq/L (ref 96–112)
CO2: 26 mEq/L (ref 19–32)
Calcium: 9.4 mg/dL (ref 8.4–10.5)
Creatinine, Ser: 0.73 mg/dL (ref 0.40–1.20)
GFR: 81.96 mL/min (ref >60.00)
Glucose, Bld: 166 mg/dL — ABNORMAL HIGH (ref 70–99)
POTASSIUM: 3.6 meq/L (ref 3.5–5.1)
Sodium: 139 mEq/L (ref 135–145)

## 2015-01-20 LAB — TSH: TSH: 1.58 u[IU]/mL (ref 0.35–4.50)

## 2015-01-20 LAB — C-REACTIVE PROTEIN: CRP: 0.4 mg/dL — ABNORMAL LOW (ref 0.5–20.0)

## 2015-01-20 LAB — VITAMIN B12: VITAMIN B 12: 71 pg/mL — AB (ref 211–911)

## 2015-01-20 MED ORDER — NORTRIPTYLINE HCL 10 MG PO CAPS: ORAL_CAPSULE | ORAL | Status: DC

## 2015-01-20 NOTE — Progress Notes (Signed)
[facility] Clinic Note - Initial Visit   Date: 01/20/2015   KENNIYA WESTRICH MRN: [id number] DOB: [birth year]   Dear Dr. Manuella Ghazi:  Thank you for your kind referral of Leamon Arnt for consultation of right sided headaches. Although her history is well known to you, please allow [place] to reiterate it for the purpose of our medical record. The patient was accompanied to the clinic by daughter who also provides collateral information.     History of Present Illness: Doniesha A Bulluck is a 80 y.o. right-handed Caucasian female with hypertension, diabetes mellitus, colon cancer s/p resection (2009), and hypothyroidism presenting for evaluation of right sided headaches.    In May 2016, she developed new onset of right sided parietal headaches, described as pressure-like and severe.  She was found to have severely elevated blood pressure of 200/100 and was started on new blood pressure medication.  Headache slowly improved as her blood pressure came down.  She remained headache free for several weeks.   On 6/22, she was volunteering at work and developed acute onset of right sided headaches again with radiation into her right face. She has black dots in her right eye only which move around. There was no visual field changes or loss of vision.  She went to the emergency department because of associated right sided arm weakness.  No numbness/tingling, dysphagia, or dysarthria.  MRI brain was personally reviewed and does not show any acute stroke.  She had not had her eyes checked.   She has noticed that generalized sensation of weakness involving both arms and legs which is intermittent, but worse in her right hand.    Since then, she has intermittent pounding pain involving the right base of the head.  Pain generally lasts about 2 hours, occuring daily. Movement exacerbates pain. Tylenol helps. She takes xanax which helps alleviate the pain after two hours.   Out-side  paper records, electronic medical record, and images have been reviewed where available and summarized as:  US carotids 01/11/2015:  Less than 50% diameter stenosis of the ICA bilaterally; however, distal right ICA could not be visualized.  There is anterograde flow in both vertebral arteries.  MRI brain wo contrast 01/06/2015:  No acute findings.  Chronic small vessel disease has progressed from 2009 but is mild for age.  Small lacunar infarct has occurred in the left corona radiata.   Past Medical History  Diagnosis Date  . Diabetes mellitus   . Hypothyroidism   . Pulmonary embolism     with gallbladder surg in 83-[place]  . Cancer 4 yrs ago    colon cancer-ascending colon-[facility]  . Arthritis   . Stroke   . Hypertension   . Memory loss     Past Surgical History  Procedure Laterality Date  . Cholecystectomy  1981    [place]  . Colon surgery  2009    [facility]_ascending colon cancer  . Hip surgery  1985    russell traction for cracked hip from [facility]  . Cataract extraction w/phaco  06/25/2011    Procedure: CATARACT EXTRACTION PHACO AND INTRAOCULAR LENS PLACEMENT (IOC);  Surgeon: Tonny Branch;  Location: [facility];  Service: Ophthalmology;  Laterality: Left;  Dr. Geoffry Paradise brought lens from his office; CDE: 24.21  . Cataract extraction w/phaco  10/18/2011    Procedure: CATARACT EXTRACTION PHACO AND INTRAOCULAR LENS PLACEMENT (IOC);  Surgeon: Tonny Branch, MD;  Location: [facility];  Service: Ophthalmology;  Laterality: Right;  CDE:23.43  . Colonoscopy N/A  11/21/2012    Procedure: COLONOSCOPY;  Surgeon: Rogene Houston, MD;  Location: [facility];  Service: Endoscopy;  Laterality: N/A;  930     Medications:  Current Outpatient Prescriptions on File Prior to Visit  Medication Sig Dispense Refill  . amLODipine-olmesartan (AZOR) 5-20 MG per tablet Take 1 tablet by mouth every evening.    Marland Kitchen glimepiride (AMARYL) 4 MG tablet Take 2 mg by mouth daily.     Marland Kitchen levothyroxine (SYNTHROID, LEVOTHROID)  25 MCG tablet Take 25 mcg by mouth daily.      . fish oil-omega-3 fatty acids 1000 MG capsule Take 2 g by mouth daily.    Marland Kitchen gabapentin (NEURONTIN) 100 MG capsule Take 1 capsule (100 mg total) by mouth 2 (two) times daily. (Patient not taking: Reported on 01/20/2015) 60 capsule 0  . neomycin-polymyxin-hydrocortisone (CORTISPORIN) otic solution Place 1 drop into the right ear daily as needed. For ear wax per patient    . [DISCONTINUED] omega-3 acid ethyl esters (LOVAZA) 1 G capsule Take 2 g by mouth daily.       No current facility-administered medications on file prior to visit.    Allergies:  Allergies  Allergen Reactions  . Adhesive [Tape] Rash    Family History: Family History  Problem Relation Age of Onset  . Anesthesia problems Neg Hx   . Hypotension Neg Hx   . Malignant hyperthermia Neg Hx   . Pseudochol deficiency Neg Hx   . Colon polyps Neg Hx   . Colon cancer Other   . Cancer Mother     brain   . Thyroid disease Mother   . Hypertension Mother   . Hypertension Father   . Heart failure Father   . Thyroid disease Daughter   . Hypertension Maternal Grandmother   . Hypertension Maternal Grandfather   . Hypertension Paternal Grandmother   . Hypertension Paternal Grandfather   . Heart failure Paternal Grandfather     Social History: History   Social History  . Marital Status: Married    Spouse Name: N/A  . Number of Children: N/A  . Years of Education: N/A   Occupational History  . Not on file.   Social History Main Topics  . Smoking status: Never Smoker   . Smokeless tobacco: Never Used  . Alcohol Use: No  . Drug Use: No  . Sexual Activity: No   Other Topics Concern  . Not on file   Social History Narrative    Review of Systems:  CONSTITUTIONAL: No fevers, chills, night sweats, or weight loss.   EYES: +visual changes or eye pain ENT: No hearing changes.  No history of nose bleeds.   RESPIRATORY: No cough, wheezing and shortness of breath.     CARDIOVASCULAR: Negative for chest pain, and palpitations.   GI: Negative for abdominal discomfort, blood in stools or black stools.  No recent change in bowel habits.   GU:  No history of incontinence.   MUSCLOSKELETAL: No history of joint pain or swelling.  No myalgias.   SKIN: Negative for lesions, rash, and itching.   HEMATOLOGY/ONCOLOGY: Negative for prolonged bleeding, bruising easily, and swollen nodes.     ENDOCRINE: Negative for cold or heat intolerance, polydipsia or goiter.   PSYCH:  +depression or anxiety symptoms.   NEURO: As Above.   Vital Signs:  BP 130/66 mmHg  Pulse 84  Resp 20  Ht 5' 5"  (1.651 m)  Wt 175 lb 9.6 oz (79.652 kg)  BMI 29.22 kg/m2  General Medical Exam:   General:  Well appearing, comfortable.   Eyes/ENT: see cranial nerve examination.   Neck: No masses appreciated.  Full range of motion without tenderness.  No carotid bruits. Respiratory:  Clear to auscultation, good air entry bilaterally.   Cardiac:  Systolic murmur, regular rate Extremities:  No deformities, edema, or skin discoloration.  Skin:  Ecchymosis over the hands  Neurological Exam: MENTAL STATUS including orientation to time, place, person, recent and remote memory, attention span and concentration, language, and fund of knowledge is fairly intact.  Speech is not dysarthric.  CRANIAL NERVES: II:  No visual field defects.  Unremarkable fundi.   III-IV-VI: Pupils equal round and reactive to light.  Normal conjugate, extra-ocular eye movements in all directions of gaze.  No nystagmus.  Right ptosis with eyelid lag on the left (old, previous left facial surgery).   V:  Normal facial sensation.    VII:  Normal facial symmetry and movements.  VIII:  Normal hearing and vestibular function.   IX-X:  Normal palatal movement.   XI:  Normal shoulder shrug and head rotation.   XII:  Normal tongue strength and range of motion, no deviation or fasciculation.  MOTOR:  No atrophy, fasciculations  or abnormal movements.  No pronator drift.  Tone is normal.    Right Upper Extremity:    Left Upper Extremity:    Deltoid  5/5   Deltoid  5/5   Biceps  5/5   Biceps  5/5   Triceps  5/5   Triceps  5/5   Wrist extensors  5/5   Wrist extensors  5/5   Wrist flexors  5/5   Wrist flexors  5/5   Finger extensors  5/5   Finger extensors  5/5   Finger flexors  5/5   Finger flexors  5/5   Dorsal interossei  5/5   Dorsal interossei  5/5   Abductor pollicis  5/5   Abductor pollicis  5/5   Tone (Ashworth scale)  0  Tone (Ashworth scale)  0   Right Lower Extremity:    Left Lower Extremity:    Hip flexors  5/5   Hip flexors  5/5   Hip extensors  5/5   Hip extensors  5/5   Knee flexors  5/5   Knee flexors  5/5   Knee extensors  5/5   Knee extensors  5/5   Dorsiflexors  5/5   Dorsiflexors  5/5   Plantarflexors  5/5   Plantarflexors  5/5   Toe extensors  5/5   Toe extensors  5/5   Toe flexors  5/5   Toe flexors  5/5   Tone (Ashworth scale)  0  Tone (Ashworth scale)  0   MSRs:  Right                                                                 Left brachioradialis 2+  brachioradialis 2+  biceps 2+  biceps 2+  triceps 2+  triceps 2+  patellar 2+  patellar 2+  ankle jerk 0  ankle jerk 0  plantar response mute  plantar response mute   SENSORY:  Normal and symmetric perception of light touch, pinprick, vibration, and proprioception.   COORDINATION/GAIT: Normal finger-to- nose-finger and heel-to-shin.  Intact rapid alternating movements bilaterally.  Gait slightly antalgic, but stable. Tandem and stressed gait intact.    IMPRESSION: New onset right side-locked headaches with visual changes MRI brain does not reveal any new stroke, US carotids with < 50% ICA stenosis bilaterally With new headaches in patient of her age, need to exclude worrisome condition such as intracranial aneurysm, so will order CTA Also, will check inflammatory markers for giant cell arteritis, however would expect  headaches to be temporal and she has them more at the base of the head If work-up returns normal, need to consider episodic migraine variant with aura  Generalized malaise without lateralizing findings Check TSH and vitamin B12 MRI brain personally reviewed and shows old left lacunar infarct which would not manifest with her current symptomology   PLAN/RECOMMENDATIONS:  1.  Check ESR, CRP, vitamin B12, TSH, BMP 2.  CT angiogram head  3.  Recommend eye evaluation 4.  Start nortriptyline 26m at bedtime for two weeks, then increase 252m(2 tablets) at bedtime 5.  Return to clinic in 6 weeks    The duration of this appointment visit was 60 minutes of face-to-face time with the patient.  Greater than 50% of this time was spent in counseling, explanation of diagnosis, planning of further management, and coordination of care.   Thank you for allowing me to participate in patient's care.  If I can answer any additional questions, I would be pleased to do so.    Sincerely,    Elizzie Westergard K. PaPosey ProntoDO

## 2015-01-20 NOTE — Patient Instructions (Addendum)
1.  CT angiogram head  2.  Start nortriptyline 10mg  at bedtime for two weeks, then increase 20mg  (2 tablets) at bedtime 3.  Check blood work 4.  Please have your eyes checked 5.  Return to clinic in 6 weeks

## 2015-01-21 ENCOUNTER — Other Ambulatory Visit: Payer: Self-pay | Admitting: *Deleted

## 2015-01-21 LAB — SEDIMENTATION RATE: Sed Rate: 23 mm/hr — ABNORMAL HIGH (ref 0–22)

## 2015-01-21 MED ORDER — "SYRINGE 23G X 1"" 3 ML MISC": 1.0000 | Status: DC

## 2015-01-21 MED ORDER — CYANOCOBALAMIN 1000 MCG/ML IJ SOLN: 1000.0000 ug | Freq: Once | INTRAMUSCULAR | Status: DC

## 2015-01-21 NOTE — Progress Notes (Signed)
Note sent

## 2015-01-24 ENCOUNTER — Telehealth: Payer: Self-pay | Admitting: *Deleted

## 2015-01-24 NOTE — Telephone Encounter (Signed)
PA: [id number]

## 2015-01-25 ENCOUNTER — Ambulatory Visit (HOSPITAL_COMMUNITY)
Admission: RE | Admit: 2015-01-25 | Discharge: 2015-01-25 | Disposition: A | Discharge status: Home / Self Care | Payer: Medicare HMO | Source: Ambulatory Visit | Attending: Neurology | Admitting: Neurology

## 2015-01-25 DIAGNOSIS — R531 Weakness: Secondary | ICD-10-CM | POA: Insufficient documentation

## 2015-01-25 DIAGNOSIS — I672 Cerebral atherosclerosis: Secondary | ICD-10-CM | POA: Insufficient documentation

## 2015-01-25 DIAGNOSIS — I6523 Occlusion and stenosis of bilateral carotid arteries: Secondary | ICD-10-CM | POA: Insufficient documentation

## 2015-01-25 DIAGNOSIS — R519 Headache, unspecified: Secondary | ICD-10-CM

## 2015-01-25 DIAGNOSIS — R51 Headache: Secondary | ICD-10-CM | POA: Diagnosis present

## 2015-01-25 DIAGNOSIS — I6503 Occlusion and stenosis of bilateral vertebral arteries: Secondary | ICD-10-CM | POA: Insufficient documentation

## 2015-01-25 DIAGNOSIS — H539 Unspecified visual disturbance: Secondary | ICD-10-CM

## 2015-01-25 MED ORDER — IOHEXOL 350 MG/ML SOLN
100.0000 mL | Freq: Once | INTRAVENOUS | Status: AC | PRN
  Administered 2015-01-25: 75 mL via INTRAVENOUS

## 2015-03-02 ENCOUNTER — Encounter: Payer: Self-pay | Admitting: Neurology

## 2015-03-17 ENCOUNTER — Encounter: Payer: Self-pay | Admitting: Neurology

## 2015-03-17 ENCOUNTER — Ambulatory Visit (INDEPENDENT_AMBULATORY_CARE_PROVIDER_SITE_OTHER): Payer: Medicare HMO | Admitting: Neurology

## 2015-03-17 VITALS — BP 130/64 | HR 93 | Ht 65.0 in | Wt 184.4 lb

## 2015-03-17 DIAGNOSIS — R531 Weakness: Secondary | ICD-10-CM

## 2015-03-17 DIAGNOSIS — E538 Deficiency of other specified B group vitamins: Secondary | ICD-10-CM | POA: Diagnosis not present

## 2015-03-17 NOTE — Progress Notes (Signed)
Follow-up Visit   Date: 03/17/2015    Valerie Boyd MRN: [id number] DOB: 1934/12/04   Interim History: Valerie Boyd is a 80 y.o. right-handed Caucasian female with hypertension, diabetes mellitus, colon cancer s/p resection (2009), and hypothyroidism returning to the clinic for follow-up of new onset headaches and generalized malaise.  The patient was accompanied to the clinic by daughter who also provides collateral information.    History of present illness: In May 2016, she developed new onset of right sided parietal headaches, described as pressure-like and severe.  She was found to have severely elevated blood pressure of 200/100 and was started on new blood pressure medication.  Headache slowly improved as her blood pressure came down.  She remained headache free for several weeks.   On 6/22, she was volunteering at work and developed acute onset of right sided headaches again with radiation into her right face. She has black dots in her right eye only which move around. There was no visual field changes or loss of vision.  She went to the emergency department because of associated right sided arm weakness.  No numbness/tingling, dysphagia, or dysarthria.  MRI brain was personally reviewed and does not show any acute stroke.  She had not had her eyes checked.   She has noticed that generalized sensation of weakness involving both arms and legs which is intermittent, but worse in her right hand.    Since then, she has intermittent pounding pain involving the right base of the head.  Pain generally lasts about 2 hours, occuring daily. Movement exacerbates pain. Tylenol helps. She takes xanax which helps alleviate the pain after two hours.  UPDATE 03/17/2015:  Her labs indicated severe vitamin B12 deficiency and within 3 weeks of being supplemented, all her symptoms resolved including vision changes, headaches, weakness, and paresthesias.  She is feeling.  She does not consume much  red meat.  She has occasional numbness of the right great toe and also complains of chronic right knee pain.     Medications:  Current Outpatient Prescriptions on File Prior to Visit  Medication Sig Dispense Refill  . amLODipine-olmesartan (AZOR) 5-20 MG per tablet Take 1 tablet by mouth every evening.    Marland Kitchen aspirin 81 MG tablet Take 81 mg by mouth daily.    . cyanocobalamin (,VITAMIN B-12,) 1000 MCG/ML injection Inject 1 mL (1,000 mcg total) into the muscle once. Start with 1 ml IM qday x 7 days then 1 ml IM q week x 4 weeks then 1 ml q month x 1 year 25 mL 0  . fish oil-omega-3 fatty acids 1000 MG capsule Take 2 g by mouth daily.    Marland Kitchen glimepiride (AMARYL) 4 MG tablet Take 2 mg by mouth daily.     Marland Kitchen levothyroxine (SYNTHROID, LEVOTHROID) 25 MCG tablet Take 25 mcg by mouth daily.      Marland Kitchen neomycin-polymyxin-hydrocortisone (CORTISPORIN) otic solution Place 1 drop into the right ear daily as needed. For ear wax per patient    . Syringe/Needle, Disp, (SYRINGE 3CC/23GX1") 23G X 1" 3 ML MISC 1 Package by Does not apply route every 30 (thirty) days. 1 each 0  . [DISCONTINUED] omega-3 acid ethyl esters (LOVAZA) 1 G capsule Take 2 g by mouth daily.       No current facility-administered medications on file prior to visit.    Allergies:  Allergies  Allergen Reactions  . Adhesive [Tape] Rash    Review of Systems:  CONSTITUTIONAL: No fevers, chills,  night sweats, or weight loss.  EYES: No visual changes or eye pain ENT: No hearing changes.  No history of nose bleeds.   RESPIRATORY: No cough, wheezing and shortness of breath.   CARDIOVASCULAR: Negative for chest pain, and palpitations.   GI: Negative for abdominal discomfort, blood in stools or black stools.  No recent change in bowel habits.   GU:  No history of incontinence.   MUSCLOSKELETAL: No history of joint pain or swelling.  No myalgias.   SKIN: Negative for lesions, rash, and itching.   ENDOCRINE: Negative for cold or heat intolerance,  polydipsia or goiter.   PSYCH:  No depression or anxiety symptoms.   NEURO: As Above.   Vital Signs:  BP 130/64 mmHg  Pulse 93  Ht $R'5\' 5"'iy$  (1.651 m)  Wt 184 lb 7 oz (83.66 kg)  BMI 30.69 kg/m2  SpO2 97%  Neurological Exam: MENTAL STATUS including orientation to time, place, person, recent and remote memory, attention span and concentration, language, and fund of knowledge is normal.  Speech is not dysarthric.  CRANIAL NERVES: No visual field defects.  Pupils equal round and reactive to light.  Normal conjugate, extra-ocular eye movements in all directions of gaze. Normal facial sensation.  Face is symmetric. Palate elevates symmetrically.  Tongue is midline.  MOTOR:  Motor strength is 5/5 in all extremities.    SENSORY:  Intact to vibration throughout.  COORDINATION/GAIT:  Mild unsteadiness with gait, walks unassisted.  Data: US carotids 01/11/2015:  Less than 50% diameter stenosis of the ICA bilaterally; however, distal right ICA could not be visualized.  There is anterograde flow in both vertebral arteries.  MRI brain wo contrast 01/06/2015:  No acute findings.  Chronic small vessel disease has progressed from 2009 but is mild for age.  Small lacunar infarct has occurred in the left corona radiata. CT angiogram 01/25/2015: Chronic microvascular ischemic change in the white matter. No acute infarct or intracranial mass   Moderate atherosclerotic disease in the cavernous carotid bilaterally.   Moderate stenosis distal left vertebral artery. Mild stenosis distal right vertebral artery due to atherosclerotic calcification. Mild to moderate atherosclerotic disease in the posterior cerebral arteries Bilaterally.   Labs 01/20/2015:  ESR 23, CRP 0.4, vitamin B12 71*, TSH 1.58   IMPRESSION/PLAN: New onset right side-locked headaches with visual changes - resolved MRI brain does not reveal any new stroke, US carotids with < 50% ICA stenosis bilaterally, no aneurysm or dissection on  CTA Inflammatory markers within normal limits excluding giant cell arteritis  Vitamin B12 deficiency, severe and most likely causing her generalized malaise and neurological symptoms which has since resolved.  She is looking and feeling well! Continue vitamin B12 1012mcg injections monthly  Distal and symmetric neuropathy of the feet, likely due to diabetes and B12 deficiency Continue to monitor  Return to clinic as needed   The duration of this appointment visit was 20 minutes of face-to-face time with the patient.  Greater than 50% of this time was spent in counseling, explanation of diagnosis, planning of further management, and coordination of care.   Thank you for allowing me to participate in patient's care.  If I can answer any additional questions, I would be pleased to do so.    Sincerely,    Donika K. Posey Pronto, DO

## 2015-03-17 NOTE — Patient Instructions (Signed)
You look great today! Continue your vitamin B12 injections monthly Return to clinic as needed

## 2015-09-12 IMAGING — CT CT ANGIO HEAD
1 of 9 series · 5 of 47 positions shown · IV contrast (Omnipaque 300)
Comparison: CT head 01/18/2015

CLINICAL DATA: New onset headache. Generalized weakness. Vision
change.

EXAM:
CT ANGIOGRAPHY HEAD
TECHNIQUE: Multidetector CT imaging of the head was performed using the
standard protocol during bolus administration of intravenous
contrast. Multiplanar CT image reconstructions and MIPs were
obtained to evaluate the vascular anatomy.
CONTRAST:  75mL OMNIPAQUE IOHEXOL 350 MG/ML SOLN

[Series 6: brain angio 2.0 pacs · axial · 0.42mm/px · z∈[+62,+172]mm · 5 of 83 slices shown]
[im 14/83  brain]
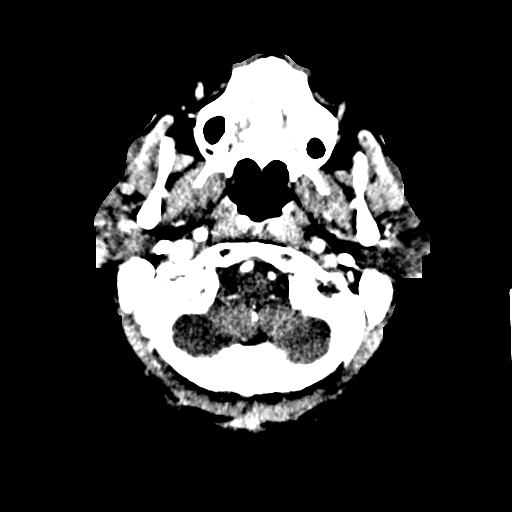
[im 28/83  bone]
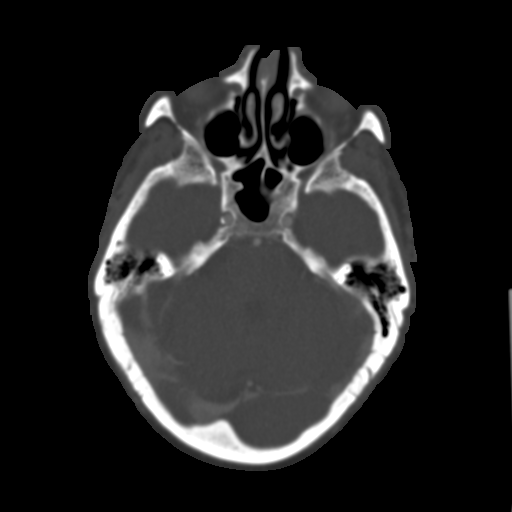
[im 42/83  brain]
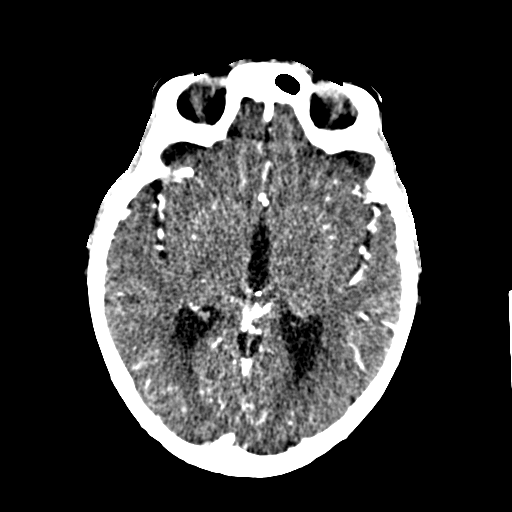
[im 55/83  bone]
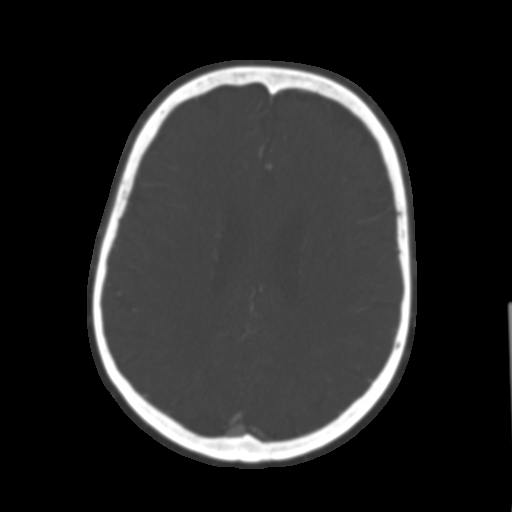
[im 69/83  brain]
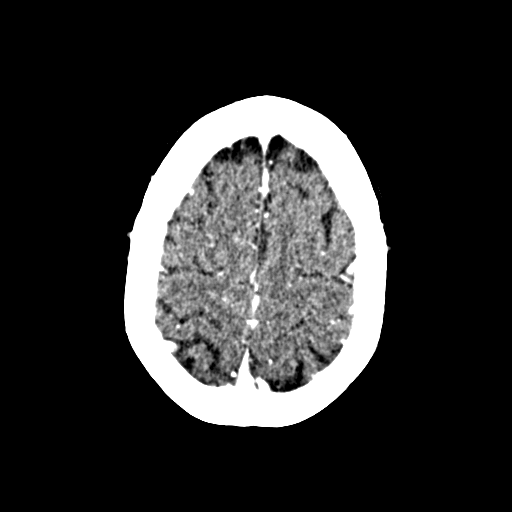

[5 of 47 positions shown; findings below may reference images not displayed]

FINDINGS: CT HEAD

Brain: Mild atrophy. Mild chronic microvascular ischemic change in
the white matter. No acute infarct. Negative for hemorrhage or mass

Calvarium and skull base: Negative

Paranasal sinuses: Negative

Orbits: Negative for orbital mass.

CTA HEAD

Anterior circulation: Atherosclerotic disease throughout the right
cavernous carotid with mild to moderate stenosis. Right anterior and
right middle cerebral arteries widely patent

Atherosclerotic calcification throughout the left cavernous carotid
with mild to moderate stenosis. Left anterior and middle cerebral
arteries are patent.

Posterior circulation: Atherosclerotic calcification distal
vertebral artery with moderate stenosis on the left and mild
stenosis on the right. PICA patent bilaterally. Basilar patent.
Superior cerebellar and posterior cerebral arteries are patent
bilaterally. There is moderate irregularity of the posterior
cerebral arteries bilaterally with multiple areas of atherosclerotic
stenosis of mild-to-moderate degree.

Venous sinuses: Hypoplastic left transverse sinus. Remaining sinuses
are patent without filling defect.

Anatomic variants: Negative for cerebral aneurysm

Delayed phase:Multiple enhancement on delayed imaging
IMPRESSION: Chronic microvascular ischemic change in the white matter. No acute
infarct or intracranial mass

Moderate atherosclerotic disease in the cavernous carotid
bilaterally.

Moderate stenosis distal left vertebral artery. Mild stenosis distal
right vertebral artery due to atherosclerotic calcification. Mild to
moderate atherosclerotic disease in the posterior cerebral arteries
bilaterally.

## 2015-11-29 ENCOUNTER — Encounter (INDEPENDENT_AMBULATORY_CARE_PROVIDER_SITE_OTHER): Payer: Self-pay | Admitting: *Deleted

## 2016-04-24 LAB — HEMOGLOBIN A1C: Hemoglobin A1C: 7.4

## 2016-05-31 ENCOUNTER — Ambulatory Visit (INDEPENDENT_AMBULATORY_CARE_PROVIDER_SITE_OTHER): Payer: Medicare HMO | Admitting: "Endocrinology

## 2016-05-31 ENCOUNTER — Encounter: Payer: Self-pay | Admitting: "Endocrinology

## 2016-05-31 VITALS — BP 174/73 | HR 92 | Ht 65.0 in | Wt 175.0 lb

## 2016-05-31 DIAGNOSIS — E039 Hypothyroidism, unspecified: Secondary | ICD-10-CM | POA: Diagnosis not present

## 2016-05-31 DIAGNOSIS — E1165 Type 2 diabetes mellitus with hyperglycemia: Secondary | ICD-10-CM | POA: Diagnosis not present

## 2016-05-31 DIAGNOSIS — E118 Type 2 diabetes mellitus with unspecified complications: Secondary | ICD-10-CM | POA: Diagnosis not present

## 2016-05-31 DIAGNOSIS — E7800 Pure hypercholesterolemia, unspecified: Secondary | ICD-10-CM | POA: Diagnosis not present

## 2016-05-31 DIAGNOSIS — IMO0002 Reserved for concepts with insufficient information to code with codable children: Secondary | ICD-10-CM

## 2016-05-31 DIAGNOSIS — I10 Essential (primary) hypertension: Secondary | ICD-10-CM

## 2016-05-31 MED ORDER — METFORMIN HCL 500 MG PO TABS: 500.0000 mg | ORAL_TABLET | Freq: Two times a day (BID) | ORAL | 2 refills | Status: DC

## 2016-05-31 MED ORDER — LISINOPRIL-HYDROCHLOROTHIAZIDE 20-25 MG PO TABS: 1.0000 | ORAL_TABLET | Freq: Every day | ORAL | 3 refills | Status: DC

## 2016-05-31 NOTE — Progress Notes (Signed)
Subjective:    Patient ID: Leamon Arnt, female    DOB: 1935/01/23. Patient is being seen in consultation for management of diabetes requested by  [facility], MD  Past Medical History:  Diagnosis Date  . Arthritis   . Cancer ([place]) 4 yrs ago   colon cancer-ascending colon-[facility]  . Diabetes mellitus   . Hypertension   . Hypothyroidism   . Memory loss   . Pulmonary embolism ([place])    with gallbladder surg in 81-[place]  . Stroke [facility])    Past Surgical History:  Procedure Laterality Date  . CATARACT EXTRACTION W/PHACO  06/25/2011   Procedure: CATARACT EXTRACTION PHACO AND INTRAOCULAR LENS PLACEMENT (IOC);  Surgeon: Tonny Branch;  Location: [facility] ORS;  Service: Ophthalmology;  Laterality: Left;  Dr. Geoffry Paradise brought lens from his office; CDE: 24.21  . CATARACT EXTRACTION W/PHACO  10/18/2011   Procedure: CATARACT EXTRACTION PHACO AND INTRAOCULAR LENS PLACEMENT (IOC);  Surgeon: Tonny Branch, MD;  Location: [facility] ORS;  Service: Ophthalmology;  Laterality: Right;  CDE:23.43  . CHOLECYSTECTOMY  1981   [place]  . COLON SURGERY  2009   [facility]_ascending colon cancer  . COLONOSCOPY N/A 11/21/2012   Procedure: COLONOSCOPY;  Surgeon: Rogene Houston, MD;  Location: [facility] ENDO SUITE;  Service: Endoscopy;  Laterality: N/A;  930  . HIP SURGERY  1985   russell traction for cracked hip from [facility]-right   Social History   Social History  . Marital status: Married    Spouse name: N/A  . Number of children: N/A  . Years of education: N/A   Social History Main Topics  . Smoking status: Never Smoker  . Smokeless tobacco: Never Used  . Alcohol use No  . Drug use: No  . Sexual activity: No   Other Topics Concern  . None   Social History Narrative   She lives with husband in a two-story home.  They have two living children.   Retired Marine scientist.   Highest level of education:  college   Outpatient Encounter Prescriptions as of 05/31/2016  Medication Sig  . cyanocobalamin (,VITAMIN B-12,) 1000 MCG/ML  injection Inject 1 mL (1,000 mcg total) into the muscle once. Start with 1 ml IM qday x 7 days then 1 ml IM q week x 4 weeks then 1 ml q month x 1 year  . fish oil-omega-3 fatty acids 1000 MG capsule Take 2 g by mouth daily.  Marland Kitchen glimepiride (AMARYL) 4 MG tablet Take 2 mg by mouth daily.  Marland Kitchen levothyroxine (SYNTHROID, LEVOTHROID) 25 MCG tablet Take 25 mcg by mouth daily.    . Vitamin D, Ergocalciferol, (DRISDOL) 50000 units CAPS capsule Take 50,000 Units by mouth 2 (two) times a week.  . metFORMIN (GLUCOPHAGE) 500 MG tablet Take 1 tablet (500 mg total) by mouth 2 (two) times daily with a meal.  . [DISCONTINUED] amLODipine-olmesartan (AZOR) 5-20 MG per tablet Take 1 tablet by mouth every evening.  . [DISCONTINUED] aspirin 81 MG tablet Take 81 mg by mouth daily.  . [DISCONTINUED] neomycin-polymyxin-hydrocortisone (CORTISPORIN) otic solution Place 1 drop into the right ear daily as needed. For ear wax per patient  . [DISCONTINUED] simvastatin (ZOCOR) 10 MG tablet take 1 tablet by mouth WITH EVENING MEAL OR BEDTIME  . [DISCONTINUED] Syringe/Needle, Disp, (SYRINGE 3CC/23GX1") 23G X 1" 3 ML MISC 1 Package by Does not apply route every 30 (thirty) days.   No facility-administered encounter medications on file as of 05/31/2016.    ALLERGIES: Allergies  Allergen Reactions  .  Adhesive [Tape] Rash   VACCINATION STATUS: Immunization History  Administered Date(s) Administered  . Influenza Whole 03/16/2006, 05/13/2007  . Pneumococcal Polysaccharide-23 03/16/2006    Diabetes  She presents for her initial diabetic visit. She has type 2 diabetes mellitus. Onset time: She was diagnosed at approximate age of 24 years. Her disease course has been worsening. There are no hypoglycemic associated symptoms. Pertinent negatives for hypoglycemia include no headaches, seizures or tremors. Associated symptoms include polydipsia and polyuria. Pertinent negatives for diabetes include no chest pain. There are no  hypoglycemic complications. Symptoms are worsening. There are no diabetic complications. Risk factors for coronary artery disease include dyslipidemia, diabetes mellitus and sedentary lifestyle. Current diabetic treatment includes oral agent (monotherapy). Her weight is stable. She is following a generally unhealthy diet. When asked about meal planning, she reported none. She has had a previous visit with a dietitian. She never participates in exercise. Home blood sugar record trend: She did not bring the meter nor logs to review today. She admits that she does not monitor blood glucose regularly. An ACE inhibitor/angiotensin II receptor blocker is not being taken.  Hyperlipidemia  This is a chronic problem. Exacerbating diseases include diabetes. Pertinent negatives include no chest pain, myalgias or shortness of breath. Current antihyperlipidemic treatment includes bile acid squestrants.      Review of Systems  Constitutional: Negative for chills and fever.  Respiratory: Negative for cough and shortness of breath.   Cardiovascular: Negative for chest pain and palpitations.       No Shortness of breath  Gastrointestinal: Negative for abdominal pain, diarrhea, nausea and vomiting.  Endocrine: Positive for polydipsia and polyuria.  Genitourinary: Negative for frequency, hematuria and urgency.  Musculoskeletal: Negative for myalgias.  Skin: Negative for rash.  Neurological: Negative for tremors, seizures and headaches.  Hematological: Does not bruise/bleed easily.  Psychiatric/Behavioral: Negative for hallucinations and suicidal ideas.    Objective:    BP (!) 174/73   Pulse 92   Ht 5\' 5"  (1.651 m)   Wt 175 lb (79.4 kg)   BMI 29.12 kg/m   Wt Readings from Last 3 Encounters:  05/31/16 175 lb (79.4 kg)  03/17/15 184 lb 7 oz (83.7 kg)  01/20/15 175 lb 9.6 oz (79.7 kg)    Physical Exam  Constitutional: She is oriented to person, place, and time. She appears well-developed.  HENT:   Head: Normocephalic and atraumatic.  Eyes: EOM are normal.  Neck: Normal range of motion. Neck supple. No tracheal deviation present. No thyromegaly present.  Cardiovascular: Normal rate and regular rhythm.   Pulmonary/Chest: Effort normal and breath sounds normal.  Abdominal: Soft. Bowel sounds are normal. There is no tenderness. There is no guarding.  Musculoskeletal: Normal range of motion. She exhibits no edema.  Neurological: She is alert and oriented to person, place, and time. She has normal reflexes. No cranial nerve deficit. Coordination normal.  Skin: Skin is warm and dry. No rash noted. No erythema. No pallor.  Psychiatric: She has a normal mood and affect. Judgment normal.     CMP ( most recent) CMP     Component Value Date/Time   NA 139 01/20/2015 1523   K 3.6 01/20/2015 1523   CL 105 01/20/2015 1523   CO2 26 01/20/2015 1523   GLUCOSE 166 (H) 01/20/2015 1523   BUN 9 01/20/2015 1523   CREATININE 0.73 01/20/2015 1523   CALCIUM 9.4 01/20/2015 1523   PROT 7.2 01/18/2015 1627   ALBUMIN 3.8 01/18/2015 1627   AST 22  01/18/2015 1627   ALT 16 01/18/2015 1627   ALKPHOS 88 01/18/2015 1627   BILITOT 2.3 (H) 01/18/2015 1627   GFRNONAA >60 01/18/2015 1627   GFRAA >60 01/18/2015 1627     Diabetic Labs (most recent): Lab Results  Component Value Date   HGBA1C 7.4 04/24/2016   HGBA1C 6.6 05/26/2007   HGBA1C 6.3 02/17/2007     Lipid Panel ( most recent) Lipid Panel     Component Value Date/Time   CHOL 143 03/31/2007 2216   TRIG 103 03/31/2007 2216   HDL 40 03/31/2007 2216   CHOLHDL 3.6 Ratio 03/31/2007 2216   VLDL 21 03/31/2007 2216   LDLCALC 82 03/31/2007 2216              Assessment & Plan:   1. Uncontrolled type 2 diabetes mellitus with complication, without long-term current use of insulin ([place]) - Patient has currently uncontrolled symptomatic type 2 DM since  81 years of age,  with most recent A1c of 7.4 %.  - She does not have recent labs for  renal function.   -she remains at a high risk for more acute and chronic complications of diabetes which include CAD, CVA, CKD, retinopathy, and neuropathy. These are all discussed in detail with the patient.  - I have counseled the patient on diet management by adopting a carbohydrate restricted/protein rich diet.  - Suggestion is made for patient to avoid simple carbohydrates   from their diet including Cakes , Desserts, Ice Cream,  Soda (  diet and regular) , Sweet Tea , Candies,  Chips, Cookies, Artificial Sweeteners,   and "Sugar-free" Products . This will help patient to have stable blood glucose profile and potentially avoid unintended weight gain.  - I encouraged the patient to switch to  unprocessed or minimally processed complex starch and increased protein intake (animal or plant source), fruits, and vegetables.  - Patient is advised to stick to a routine mealtimes to eat 3 meals  a day and avoid unnecessary snacks ( to snack only to correct hypoglycemia).  - The patient will be scheduled with Jearld Fenton, RDN, CDE for individualized DM education.  - I have approached patient with the following individualized plan to manage diabetes and patient agrees:   -  Based on her near target A1c of 7.4% and relatively  recent onset of diabetes, she can be treated with maximizing oral medications before considering insulin treatment. - Reportedly, her metformin was stopped for making her blood glucose readings very tight. - She will still be a good candidate for low-dose metformin, I will prescribe metformin 500 mg by mouth twice a day. - I will lower her Amaryl 2 mg by mouth with breakfast daily. --  I  will proceed to initiate strict monitoring of glucose   daily before meals and at bedtime and return in 1 week with meter and logs.   -Patient is encouraged to call clinic for blood glucose levels less than 70 or above 300 mg /dl.  - Patient specific target  A1c;  LDL, HDL, Triglycerides,  and  Waist Circumference were discussed in detail.  2) BP/HTN:  Uncontrolled. she will be considered for ACE inhibitor therapy. 3) Lipids/HPL:  Controlled unknown, she is not on statins. I advised her to continue fish oil for now. I will obtain fasting lipid panel on subsequent labs. 4)  Weight/Diet:  he says she has regular contact with her dietitian in [place], exercise, and detailed carbohydrates information provided.   5. Hypothyroidism,  unspecified type  - She is on very small dose of levothyroxine 25 g by mouth every morning. She says she has taken this dose for the last 20 years. I will obtain TSH and free T4 along with CMP today.  6) Chronic Care/Health Maintenance:  -Patient is not  on ACEI/ARB nor Statin medications and encouraged to continue to follow up with Ophthalmology, Podiatrist at least yearly or according to recommendations, and advised to   stay away from smoking. I have recommended yearly flu vaccine and pneumonia vaccination at least every 5 years; moderate intensity exercise for up to 150 minutes weekly; and  sleep for at least 7 hours a day.  - 60 minutes of time was spent on the care of this patient , 50% of which was applied for counseling on diabetes complications and their preventions.  - Patient to bring meter and  blood glucose logs during their next visit.   - I advised patient to maintain close follow up with [facility], MD for primary care needs.  Follow up plan: - Return in about 1 week (around 06/07/2016) for follow up with meter and logs- no labs, labs today.  Glade Lloyd, MD Phone: [phone]  Fax: [phone]   05/31/2016, 2:21 PM

## 2016-05-31 NOTE — Patient Instructions (Signed)

## 2016-06-01 LAB — COMPREHENSIVE METABOLIC PANEL
ALBUMIN: 3.6 g/dL (ref 3.6–5.1)
ALT: 13 U/L (ref 6–29)
AST: 15 U/L (ref 10–35)
Alkaline Phosphatase: 89 U/L (ref 33–130)
BUN: 10 mg/dL (ref 7–25)
CALCIUM: 8.4 mg/dL — AB (ref 8.6–10.4)
CHLORIDE: 103 mmol/L (ref 98–110)
CO2: 27 mmol/L (ref 20–31)
Creat: 0.77 mg/dL (ref 0.60–0.93)
Glucose, Bld: 285 mg/dL — ABNORMAL HIGH (ref 65–99)
POTASSIUM: 3.8 mmol/L (ref 3.5–5.3)
Sodium: 138 mmol/L (ref 135–146)
TOTAL PROTEIN: 6.4 g/dL (ref 6.1–8.1)
Total Bilirubin: 2.1 mg/dL — ABNORMAL HIGH (ref 0.2–1.2)

## 2016-06-01 LAB — TSH: TSH: 1.92 mIU/L

## 2016-06-01 LAB — T4, FREE: FREE T4: 0.9 ng/dL (ref 0.8–1.8)

## 2016-06-11 ENCOUNTER — Ambulatory Visit (INDEPENDENT_AMBULATORY_CARE_PROVIDER_SITE_OTHER): Payer: Medicare HMO | Admitting: "Endocrinology

## 2016-06-11 ENCOUNTER — Encounter: Payer: Self-pay | Admitting: "Endocrinology

## 2016-06-11 VITALS — BP 152/73 | HR 81 | Ht 65.0 in | Wt 176.0 lb

## 2016-06-11 DIAGNOSIS — E039 Hypothyroidism, unspecified: Secondary | ICD-10-CM

## 2016-06-11 DIAGNOSIS — E1165 Type 2 diabetes mellitus with hyperglycemia: Secondary | ICD-10-CM | POA: Diagnosis not present

## 2016-06-11 DIAGNOSIS — E118 Type 2 diabetes mellitus with unspecified complications: Secondary | ICD-10-CM

## 2016-06-11 DIAGNOSIS — E7800 Pure hypercholesterolemia, unspecified: Secondary | ICD-10-CM

## 2016-06-11 DIAGNOSIS — IMO0002 Reserved for concepts with insufficient information to code with codable children: Secondary | ICD-10-CM

## 2016-06-11 DIAGNOSIS — I10 Essential (primary) hypertension: Secondary | ICD-10-CM

## 2016-06-11 MED ORDER — LEVOTHYROXINE SODIUM 50 MCG PO TABS: 50.0000 ug | ORAL_TABLET | Freq: Every day | ORAL | 2 refills | Status: DC

## 2016-06-11 NOTE — Patient Instructions (Signed)

## 2016-06-11 NOTE — Progress Notes (Signed)
Subjective:    Patient ID: Valerie Boyd, female    DOB: 12/07/1934. Patient is being seen in f/u for management of diabetes requested by  [facility], MD  Past Medical History:  Diagnosis Date  . Arthritis   . Cancer ([place]) 4 yrs ago   colon cancer-ascending colon-[facility]  . Diabetes mellitus   . Hypertension   . Hypothyroidism   . Memory loss   . Pulmonary embolism ([place])    with gallbladder surg in 81-[place]  . Stroke [facility])    Past Surgical History:  Procedure Laterality Date  . CATARACT EXTRACTION W/PHACO  06/25/2011   Procedure: CATARACT EXTRACTION PHACO AND INTRAOCULAR LENS PLACEMENT (IOC);  Surgeon: Tonny Branch;  Location: [facility] ORS;  Service: Ophthalmology;  Laterality: Left;  Dr. Geoffry Paradise brought lens from his office; CDE: 24.21  . CATARACT EXTRACTION W/PHACO  10/18/2011   Procedure: CATARACT EXTRACTION PHACO AND INTRAOCULAR LENS PLACEMENT (IOC);  Surgeon: Tonny Branch, MD;  Location: [facility] ORS;  Service: Ophthalmology;  Laterality: Right;  CDE:23.43  . CHOLECYSTECTOMY  1981   [place]  . COLON SURGERY  2009   [facility]_ascending colon cancer  . COLONOSCOPY N/A 11/21/2012   Procedure: COLONOSCOPY;  Surgeon: Rogene Houston, MD;  Location: [facility] ENDO SUITE;  Service: Endoscopy;  Laterality: N/A;  930  . HIP SURGERY  1985   russell traction for cracked hip from [facility]-right   Social History   Social History  . Marital status: Married    Spouse name: N/A  . Number of children: N/A  . Years of education: N/A   Social History Main Topics  . Smoking status: Never Smoker  . Smokeless tobacco: Never Used  . Alcohol use No  . Drug use: No  . Sexual activity: No   Other Topics Concern  . Not on file   Social History Narrative   She lives with husband in a two-story home.  They have two living children.   Retired Marine scientist.   Highest level of education:  college   Outpatient Encounter Prescriptions as of 06/11/2016  Medication Sig  . levothyroxine (SYNTHROID, LEVOTHROID) 50 MCG  tablet Take 1 tablet (50 mcg total) by mouth daily.  Marland Kitchen lisinopril-hydrochlorothiazide (PRINZIDE,ZESTORETIC) 20-25 MG tablet Take 1 tablet by mouth daily.  . metFORMIN (GLUCOPHAGE) 500 MG tablet Take 1 tablet (500 mg total) by mouth 2 (two) times daily with a meal.  . [DISCONTINUED] glimepiride (AMARYL) 4 MG tablet Take 2 mg by mouth daily.  . [DISCONTINUED] levothyroxine (SYNTHROID, LEVOTHROID) 25 MCG tablet Take 25 mcg by mouth daily.    . cyanocobalamin (,VITAMIN B-12,) 1000 MCG/ML injection Inject 1 mL (1,000 mcg total) into the muscle once. Start with 1 ml IM qday x 7 days then 1 ml IM q week x 4 weeks then 1 ml q month x 1 year  . fish oil-omega-3 fatty acids 1000 MG capsule Take 2 g by mouth daily.  . Vitamin D, Ergocalciferol, (DRISDOL) 50000 units CAPS capsule Take 50,000 Units by mouth 2 (two) times a week.   No facility-administered encounter medications on file as of 06/11/2016.    ALLERGIES: Allergies  Allergen Reactions  . Adhesive [Tape] Rash   VACCINATION STATUS: Immunization History  Administered Date(s) Administered  . Influenza Whole 03/16/2006, 05/13/2007  . Pneumococcal Polysaccharide-23 03/16/2006    Diabetes  She presents for her follow-up diabetic visit. She has type 2 diabetes mellitus. Onset time: She was diagnosed at approximate age of 69 years. Her disease course has been worsening.  There are no hypoglycemic associated symptoms. Pertinent negatives for hypoglycemia include no headaches, seizures or tremors. Pertinent negatives for diabetes include no chest pain, no polydipsia and no polyuria. There are no hypoglycemic complications. Symptoms are worsening. There are no diabetic complications. Risk factors for coronary artery disease include dyslipidemia, diabetes mellitus and sedentary lifestyle. Current diabetic treatment includes oral agent (monotherapy). Her weight is stable. She is following a generally unhealthy diet. When asked about meal planning, she  reported none. She has had a previous visit with a dietitian. She never participates in exercise. Home blood sugar record trend: She did not bring the meter nor logs to review today. She admits that she does not monitor blood glucose regularly. Her breakfast blood glucose range is generally 140-180 mg/dl. Her lunch blood glucose range is generally 140-180 mg/dl. Her dinner blood glucose range is generally 180-200 mg/dl. Her overall blood glucose range is 140-180 mg/dl. An ACE inhibitor/angiotensin II receptor blocker is not being taken.  Hyperlipidemia  This is a chronic problem. Exacerbating diseases include diabetes. Pertinent negatives include no chest pain, myalgias or shortness of breath. Current antihyperlipidemic treatment includes bile acid squestrants.     Review of Systems  Constitutional: Negative for chills and fever.  Respiratory: Negative for cough and shortness of breath.   Cardiovascular: Negative for chest pain and palpitations.       No Shortness of breath  Gastrointestinal: Negative for abdominal pain, diarrhea, nausea and vomiting.  Endocrine: Negative for polydipsia and polyuria.  Genitourinary: Negative for frequency, hematuria and urgency.  Musculoskeletal: Negative for myalgias.  Skin: Negative for rash.  Neurological: Negative for tremors, seizures and headaches.  Hematological: Does not bruise/bleed easily.  Psychiatric/Behavioral: Negative for hallucinations and suicidal ideas.    Objective:    BP (!) 152/73   Pulse 81   Ht 5\' 5"  (1.651 m)   Wt 176 lb (79.8 kg)   BMI 29.29 kg/m   Wt Readings from Last 3 Encounters:  06/11/16 176 lb (79.8 kg)  05/31/16 175 lb (79.4 kg)  03/17/15 184 lb 7 oz (83.7 kg)    Physical Exam  Constitutional: She is oriented to person, place, and time. She appears well-developed.  HENT:  Head: Normocephalic and atraumatic.  Eyes: EOM are normal.  Neck: Normal range of motion. Neck supple. No tracheal deviation present. No  thyromegaly present.  Cardiovascular: Normal rate and regular rhythm.   Pulmonary/Chest: Effort normal and breath sounds normal.  Abdominal: Soft. Bowel sounds are normal. There is no tenderness. There is no guarding.  Musculoskeletal: Normal range of motion. She exhibits no edema.  Neurological: She is alert and oriented to person, place, and time. She has normal reflexes. No cranial nerve deficit. Coordination normal.  Skin: Skin is warm and dry. No rash noted. No erythema. No pallor.  Psychiatric: She has a normal mood and affect. Judgment normal.     CMP ( most recent) CMP     Component Value Date/Time   NA 138 05/31/2016 1409   K 3.8 05/31/2016 1409   CL 103 05/31/2016 1409   CO2 27 05/31/2016 1409   GLUCOSE 285 (H) 05/31/2016 1409   BUN 10 05/31/2016 1409   CREATININE 0.77 05/31/2016 1409   CALCIUM 8.4 (L) 05/31/2016 1409   PROT 6.4 05/31/2016 1409   ALBUMIN 3.6 05/31/2016 1409   AST 15 05/31/2016 1409   ALT 13 05/31/2016 1409   ALKPHOS 89 05/31/2016 1409   BILITOT 2.1 (H) 05/31/2016 1409   GFRNONAA >60 01/18/2015 1627  GFRAA >60 01/18/2015 1627     Diabetic Labs (most recent): Lab Results  Component Value Date   HGBA1C 7.4 04/24/2016   HGBA1C 6.6 05/26/2007   HGBA1C 6.3 02/17/2007     Lipid Panel ( most recent) Lipid Panel     Component Value Date/Time   CHOL 143 03/31/2007 2216   TRIG 103 03/31/2007 2216   HDL 40 03/31/2007 2216   CHOLHDL 3.6 Ratio 03/31/2007 2216   VLDL 21 03/31/2007 2216   LDLCALC 82 03/31/2007 2216      Assessment & Plan:   1. Uncontrolled type 2 diabetes mellitus with complication, without long-term current use of insulin ([place]) - Patient has currently uncontrolled symptomatic type 2 DM since  81 years of age,  with most recent A1c of 7.4 %.  - She does not have recent labs for renal function.   -she remains at a high risk for more acute and chronic complications of diabetes which include CAD, CVA, CKD, retinopathy, and  neuropathy. These are all discussed in detail with the patient.  - I have counseled the patient on diet management by adopting a carbohydrate restricted/protein rich diet.  - Suggestion is made for patient to avoid simple carbohydrates   from their diet including Cakes , Desserts, Ice Cream,  Soda (  diet and regular) , Sweet Tea , Candies,  Chips, Cookies, Artificial Sweeteners,   and "Sugar-free" Products . This will help patient to have stable blood glucose profile and potentially avoid unintended weight gain.  - I encouraged the patient to switch to  unprocessed or minimally processed complex starch and increased protein intake (animal or plant source), fruits, and vegetables.  - Patient is advised to stick to a routine mealtimes to eat 3 meals  a day and avoid unnecessary snacks ( to snack only to correct hypoglycemia).  - The patient will be scheduled with Jearld Fenton, RDN, CDE for individualized DM education.  - I have approached patient with the following individualized plan to manage diabetes and patient agrees:   -  Based on her near target A1c of 7.4% and relatively  recent onset of diabetes, she can be treated with maximizing oral medications before considering insulin treatment. -  Despite the fact that she took metformin 500 mg only once a day, she'll return with near target blood glucose profile averaging 164. She will continue to be a good candidate for metformin therapy. I advised her to increase metformin to 500 mg by mouth twice a day.  - I will d/c Amaryl.  -Patient is encouraged to call clinic for blood glucose levels less than 70 or above 300 mg /dl.  - Patient specific target  A1c;  LDL, HDL, Triglycerides, and  Waist Circumference were discussed in detail.  2) BP/HTN:  Uncontrolled, at 152/73,  better than her blood pressure at last visit. She will continue to benefit from a low-dose of  ACE inhibitor therapy.  3) Lipids/HPL:  Controlled unknown, she is not on  statins. I advised her to continue fish oil for now. I will obtain fasting lipid panel on subsequent labs. 4)  Weight/Diet:  he says she has regular contact with her dietitian in [place], exercise, and detailed carbohydrates information provided.   5. Hypothyroidism, unspecified type - She'll benefit from a slight increase in her levothyroxine. I advised her to increase levothyroxine to 50 A by mouth every morning.  - We discussed about correct intake of levothyroxine, at fasting, with water, separated by at least 30  minutes from breakfast, and separated by more than 4 hours from calcium, iron, multivitamins, acid reflux medications (PPIs). -Patient is made aware of the fact that thyroid hormone replacement is needed for life, dose to be adjusted by periodic monitoring of thyroid function tests.  6) Chronic Care/Health Maintenance:  -Patient is not  on ACEI/ARB nor Statin medications and encouraged to continue to follow up with Ophthalmology, Podiatrist at least yearly or according to recommendations, and advised to   stay away from smoking. I have recommended yearly flu vaccine and pneumonia vaccination at least every 5 years; moderate intensity exercise for up to 150 minutes weekly; and  sleep for at least 7 hours a day.  - 30 minutes of time was spent on the care of this patient , 50% of which was applied for counseling on diabetes complications and their preventions.  - Patient to bring meter and  blood glucose logs during their next visit.   - I advised patient to maintain close follow up with [facility], MD for primary care needs.  Follow up plan: - Return in about 10 weeks (around 08/20/2016) for follow up with pre-visit labs.  Glade Lloyd, MD Phone: [phone]  Fax: [phone]   06/11/2016, 2:42 PM

## 2016-08-20 ENCOUNTER — Ambulatory Visit: Payer: Medicare HMO | Admitting: "Endocrinology

## 2016-09-28 ENCOUNTER — Other Ambulatory Visit: Payer: Self-pay | Admitting: "Endocrinology

## 2016-10-24 ENCOUNTER — Telehealth (INDEPENDENT_AMBULATORY_CARE_PROVIDER_SITE_OTHER): Payer: Self-pay | Admitting: *Deleted

## 2016-10-24 NOTE — Telephone Encounter (Signed)
Patient's daughter called. She states that her parents just told her that her Mother has been having abdominal pian,  lots of diarrhea with a foul odor. The odor was like it was prior to her dx of Cancer. Her last Colonoscopy was 2014. She was to have had one 2017.   Talked with Dr.Rehman -  He ask that the patient be scheduled for a TCS.  I have not reached out to her daughter , Levada Dy. Waiting for TCS to be arranged.

## 2016-10-25 ENCOUNTER — Encounter (INDEPENDENT_AMBULATORY_CARE_PROVIDER_SITE_OTHER): Payer: Self-pay | Admitting: *Deleted

## 2016-10-25 ENCOUNTER — Telehealth (INDEPENDENT_AMBULATORY_CARE_PROVIDER_SITE_OTHER): Payer: Self-pay | Admitting: *Deleted

## 2016-10-25 NOTE — Telephone Encounter (Signed)
TCS sch'd 11/09/16, patient aware

## 2016-10-25 NOTE — Telephone Encounter (Signed)
Referring MD/PCP: shah   Procedure: tcs  Reason/Indication:  Diarrhea, abd pain, hx colon ca  Has patient had this procedure before?  Yes, 2014  If so, when, by whom and where?    Is there a family history of colon cancer?   Who?  What age when diagnosed?    Is patient diabetic?   yes      Does patient have prosthetic heart valve or mechanical valve?  no  Do you have a pacemaker?  no  Has patient ever had endocarditis? no  Has patient had joint replacement within last 12 months?  no  Does patient tend to be constipated or take laxatives? no  Does patient have a history of alcohol/drug use?  no  Is patient on Coumadin, Plavix and/or Aspirin? no  Medications: Amaryl 4 mg daily (am), synthroid 50 mcg daily, metformin 500 mg daily (am), fish oil 1000 mg daily, vit d  Allergies: lisinopril  Medication Adjustment per Dr Laural Golden: hold diabetic medsevening before & morning of  Procedure date & time: 11/09/16 at 2

## 2016-10-25 NOTE — Telephone Encounter (Signed)
Patient needs trilyte 

## 2016-10-26 ENCOUNTER — Other Ambulatory Visit (INDEPENDENT_AMBULATORY_CARE_PROVIDER_SITE_OTHER): Payer: Self-pay | Admitting: *Deleted

## 2016-10-26 DIAGNOSIS — R109 Unspecified abdominal pain: Secondary | ICD-10-CM | POA: Insufficient documentation

## 2016-10-26 DIAGNOSIS — R197 Diarrhea, unspecified: Secondary | ICD-10-CM | POA: Insufficient documentation

## 2016-10-26 DIAGNOSIS — Z85038 Personal history of other malignant neoplasm of large intestine: Secondary | ICD-10-CM

## 2016-10-29 MED ORDER — PEG 3350-KCL-NA BICARB-NACL 420 G PO SOLR: 4000.0000 mL | Freq: Once | ORAL | 0 refills | Status: AC

## 2016-10-29 NOTE — Telephone Encounter (Signed)
agree

## 2016-11-09 ENCOUNTER — Encounter (HOSPITAL_COMMUNITY): Payer: Self-pay | Admitting: *Deleted

## 2016-11-09 ENCOUNTER — Ambulatory Visit (HOSPITAL_COMMUNITY)
Admission: RE | Admit: 2016-11-09 | Discharge: 2016-11-09 | Disposition: A | Discharge status: Home / Self Care | Payer: Medicare HMO | Source: Ambulatory Visit | Attending: Internal Medicine | Admitting: Internal Medicine

## 2016-11-09 ENCOUNTER — Encounter (HOSPITAL_COMMUNITY)
Admission: RE | Disposition: A | Discharge status: Home / Self Care | Payer: Self-pay | Source: Ambulatory Visit | Attending: Internal Medicine

## 2016-11-09 DIAGNOSIS — Z8371 Family history of colonic polyps: Secondary | ICD-10-CM | POA: Diagnosis not present

## 2016-11-09 DIAGNOSIS — M1990 Unspecified osteoarthritis, unspecified site: Secondary | ICD-10-CM | POA: Insufficient documentation

## 2016-11-09 DIAGNOSIS — E119 Type 2 diabetes mellitus without complications: Secondary | ICD-10-CM | POA: Insufficient documentation

## 2016-11-09 DIAGNOSIS — Z1211 Encounter for screening for malignant neoplasm of colon: Secondary | ICD-10-CM | POA: Diagnosis not present

## 2016-11-09 DIAGNOSIS — Z86711 Personal history of pulmonary embolism: Secondary | ICD-10-CM | POA: Diagnosis not present

## 2016-11-09 DIAGNOSIS — R197 Diarrhea, unspecified: Secondary | ICD-10-CM

## 2016-11-09 DIAGNOSIS — K644 Residual hemorrhoidal skin tags: Secondary | ICD-10-CM | POA: Insufficient documentation

## 2016-11-09 DIAGNOSIS — Z7984 Long term (current) use of oral hypoglycemic drugs: Secondary | ICD-10-CM | POA: Insufficient documentation

## 2016-11-09 DIAGNOSIS — Z800 Family history of malignant neoplasm of digestive organs: Secondary | ICD-10-CM | POA: Insufficient documentation

## 2016-11-09 DIAGNOSIS — I10 Essential (primary) hypertension: Secondary | ICD-10-CM | POA: Diagnosis not present

## 2016-11-09 DIAGNOSIS — Z980 Intestinal bypass and anastomosis status: Secondary | ICD-10-CM | POA: Insufficient documentation

## 2016-11-09 DIAGNOSIS — Z8673 Personal history of transient ischemic attack (TIA), and cerebral infarction without residual deficits: Secondary | ICD-10-CM | POA: Diagnosis not present

## 2016-11-09 DIAGNOSIS — Z08 Encounter for follow-up examination after completed treatment for malignant neoplasm: Secondary | ICD-10-CM | POA: Diagnosis not present

## 2016-11-09 DIAGNOSIS — Z79899 Other long term (current) drug therapy: Secondary | ICD-10-CM | POA: Diagnosis not present

## 2016-11-09 DIAGNOSIS — Z85038 Personal history of other malignant neoplasm of large intestine: Secondary | ICD-10-CM | POA: Diagnosis not present

## 2016-11-09 DIAGNOSIS — E039 Hypothyroidism, unspecified: Secondary | ICD-10-CM | POA: Insufficient documentation

## 2016-11-09 DIAGNOSIS — R109 Unspecified abdominal pain: Secondary | ICD-10-CM | POA: Insufficient documentation

## 2016-11-09 HISTORY — PX: COLONOSCOPY: SHX5424

## 2016-11-09 LAB — GLUCOSE, CAPILLARY: Glucose-Capillary: 148 mg/dL — ABNORMAL HIGH (ref 65–99)

## 2016-11-09 SURGERY — COLONOSCOPY
Anesthesia: Moderate Sedation

## 2016-11-09 MED ORDER — SODIUM CHLORIDE 0.9 % IV SOLN
INTRAVENOUS | Status: DC
  Administered 2016-11-09: 1000 mL via INTRAVENOUS

## 2016-11-09 MED ORDER — MIDAZOLAM HCL 5 MG/5ML IJ SOLN
INTRAMUSCULAR | Status: AC
  Filled 2016-11-09: qty 10

## 2016-11-09 MED ORDER — MEPERIDINE HCL 50 MG/ML IJ SOLN
INTRAMUSCULAR | Status: AC
  Filled 2016-11-09: qty 1

## 2016-11-09 MED ORDER — STERILE WATER FOR IRRIGATION IR SOLN
Status: DC | PRN
  Administered 2016-11-09: 2.5 mL

## 2016-11-09 MED ORDER — MIDAZOLAM HCL 5 MG/5ML IJ SOLN
INTRAMUSCULAR | Status: DC | PRN
  Administered 2016-11-09 (×2): 2 mg via INTRAVENOUS

## 2016-11-09 MED ORDER — MEPERIDINE HCL 50 MG/ML IJ SOLN
INTRAMUSCULAR | Status: DC | PRN
  Administered 2016-11-09 (×2): 25 mg via INTRAVENOUS

## 2016-11-09 NOTE — H&P (Signed)
Valerie Boyd is an 82 y.o. female.   Chief Complaint: Patient is here for colonoscopy. HPI: Patient is 82 year old Caucasian female was history of colonic carcinoma and is here for surveillance colonoscopy. She had right hemicolectomy in August 2009. She did not require adjuvant therapy. Last colonoscopy was in May 2014. She was recently begun on metformin and developed diarrhea. Diarrhea has resolved since medication was discontinued. He denies abdominal pain melena or rectal bleeding. Her weight has been stable. Family history significant for great grandmother on father's side had colon carcinoma in late 81s.  Past Medical History:  Diagnosis Date  . Arthritis   . Cancer ([place]) 4 yrs ago   colon cancer-ascending colon-[facility]  . Diabetes mellitus   . Hypertension   . Hypothyroidism   . Memory loss   . Pulmonary embolism ([place])    with gallbladder surg in 81-[place]  . Stroke [facility])     Past Surgical History:  Procedure Laterality Date  . CATARACT EXTRACTION W/PHACO  06/25/2011   Procedure: CATARACT EXTRACTION PHACO AND INTRAOCULAR LENS PLACEMENT (IOC);  Surgeon: Tonny Branch;  Location: [facility];  Service: Ophthalmology;  Laterality: Left;  Dr. Geoffry Paradise brought lens from his office; CDE: 24.21  . CATARACT EXTRACTION W/PHACO  10/18/2011   Procedure: CATARACT EXTRACTION PHACO AND INTRAOCULAR LENS PLACEMENT (IOC);  Surgeon: Tonny Branch, MD;  Location: [facility] ORS;  Service: Ophthalmology;  Laterality: Right;  CDE:23.43  . CHOLECYSTECTOMY  1981   [place]  . COLON SURGERY  2009   [facility]_ascending colon cancer  . COLONOSCOPY N/A 11/21/2012   Procedure: COLONOSCOPY;  Surgeon: Rogene Houston, MD;  Location: [facility] END[facility] SU[facility];  Service: Endoscopy;  Laterality: N/A;  930  . HIP SURGERY  1985   russell traction for cracked hip from [facility]-right    Family History  Problem Relation Age of Onset  . Colon cancer Other   . Cancer Mother     brain   . Thyroid disease Mother   . Hypertension Mother   .  Hypertension Father   . Heart failure Father   . Thyroid disease Daughter   . Hypertension Maternal Grandmother   . Hypertension Maternal Grandfather   . Hypertension Paternal Grandmother   . Hypertension Paternal Grandfather   . Heart failure Paternal Grandfather   . Anesthesia problems Neg Hx   . Hypotension Neg Hx   . Malignant hyperthermia Neg Hx   . Pseudochol deficiency Neg Hx   . Colon polyps Neg Hx    Social History:  reports that she has never smoked. She has never used smokeless tobacco. She reports that she does not drink alcohol or use drugs.  Allergies:  Allergies  Allergen Reactions  . Lisinopril Other (See Comments)    BLOOD PRESSURE DROPPED, DIZZINESS   . Adhesive [Tape] Rash    Medications Prior to Admission  Medication Sig Dispense Refill  . Ergocalciferol (VITAMIN D2) 400 units TABS Take 400 Units by mouth at bedtime.    . fish oil-omega-3 fatty acids 1000 MG capsule Take 1 g by mouth daily.     Marland Kitchen glimepiride (AMARYL) 4 MG tablet Take 4 mg by mouth daily.    Marland Kitchen levothyroxine (SYNTHROID, LEVOTHROID) 50 MCG tablet take 1 tablet by mouth once daily 30 tablet 2  . cyanocobalamin (,VITAMIN B-12,) 1000 MCG/ML injection Inject 1 mL (1,000 mcg total) into the muscle once. Start with 1 ml IM qday x 7 days then 1 ml IM q week x 4 weeks then 1 ml q  month x 1 year (Patient not taking: Reported on 11/07/2016) 25 mL 0  . lisinopril-hydrochlorothiazide (PRINZIDE,ZESTORETIC) 20-25 MG tablet Take 1 tablet by mouth daily. (Patient not taking: Reported on 11/07/2016) 30 tablet 3  . metFORMIN (GLUCOPHAGE) 500 MG tablet Take 1 tablet (500 mg total) by mouth 2 (two) times daily with a meal. (Patient not taking: Reported on 11/07/2016) 60 tablet 2    Results for orders placed or performed during the hospital encounter of 11/09/16 (from the past 48 hour(s))  Glucose, capillary     Status: Abnormal   Collection Time: 11/09/16  1:31 PM  Result Value Ref Range   Glucose-Capillary 148 (H)  65 - 99 mg/dL   No results found.  ROS  Blood pressure (!) 182/70, pulse 96, temperature 97.8 F (36.6 C), temperature source Oral, resp. rate 10, height 5\' 5"  (1.651 m), weight 174 lb (78.9 kg), SpO2 100 %. Physical Exam  Constitutional: She appears well-developed and well-nourished.  HENT:  Mouth/Throat: Oropharynx is clear and moist.  Eyes: Conjunctivae are normal. No scleral icterus.  Neck: No thyromegaly present.  Cardiovascular: Normal rate, regular rhythm and normal heart sounds.   No murmur heard. Respiratory: Effort normal and breath sounds normal.  GI:  Long right subcostal scar. Abdomen is soft and nontender without organomegaly or masses.  Musculoskeletal: She exhibits no edema.  Lymphadenopathy:    She has no cervical adenopathy.  Neurological: She is alert.  Skin: Skin is warm and dry.     Assessment/Plan History of colon carcinoma. Surveillance colonoscopy.  Hildred Laser, MD 11/09/2016, 1:55 PM

## 2016-11-09 NOTE — Op Note (Signed)
[facility] Patient Name: Valerie Boyd Procedure Date: 11/09/2016 1:28 PM MRN: [id number] Date of Birth: 1934/09/09 Attending MD: Hildred Laser , MD CSN: [id number] Age: 82 Admit Type: Outpatient Procedure:                Colonoscopy Indications:              High risk colon cancer surveillance: Personal                            history of colon cancer Providers:                Hildred Laser, MD, Charlyne Petrin RN, RN, Aram Candela Referring MD:             Glenda Chroman, MD Medicines:                Meperidine 50 mg IV, Midazolam 4 mg IV Complications:            No immediate complications. Estimated Blood Loss:     Estimated blood loss: none. Procedure:                Pre-Anesthesia Assessment:                           - Prior to the procedure, a History and Physical                            was performed, and patient medications and                            allergies were reviewed. The patient's tolerance of                            previous anesthesia was also reviewed. The risks                            and benefits of the procedure and the sedation                            options and risks were discussed with the patient.                            All questions were answered, and informed consent                            was obtained. Prior Anticoagulants: The patient has                            taken no previous anticoagulant or antiplatelet                            agents. ASA Grade Assessment: II - A patient with  mild systemic disease. After reviewing the risks                            and benefits, the patient was deemed in                            satisfactory condition to undergo the procedure.                           After obtaining informed consent, the colonoscope                            was passed under direct vision. Throughout the                            procedure, the  patient's blood pressure, pulse, and                            oxygen saturations were monitored continuously. The                            [id number] ([id number]) scope was introduced through                            the anus and advanced to the the ileocolonic                            anastomosis. The colonoscopy was performed without                            difficulty. The patient tolerated the procedure                            well. The quality of the bowel preparation was                            excellent. The terminal ileum and the rectum were                            photographed. Scope In: 2:07:06 PM Scope Out: 2:18:53 PM Scope Withdrawal Time: 0 hours 8 minutes 16 seconds  Total Procedure Duration: 0 hours 11 minutes 47 seconds  Findings:      The perianal exam findings include skin tags.      The digital rectal exam was normal.      There was evidence of a prior end-to-side ileo-colonic anastomosis at       the hepatic flexure. This was patent and was characterized by healthy       appearing mucosa.      The rectum, recto-sigmoid colon, sigmoid colon, descending colon,       splenic flexure and transverse colon appeared normal.      The retroflexed view of the distal rectum and anal verge was normal and       showed no anal or rectal abnormalities. Impression:               -  Perianal skin tags found on perianal exam.                           - Patent end-to-side ileo-colonic anastomosis,                            characterized by healthy appearing mucosa.                           - The rectum, recto-sigmoid colon, sigmoid colon,                            descending colon, splenic flexure and transverse                            colon are normal.                           - No specimens collected. Moderate Sedation:      Moderate (conscious) sedation was administered by the endoscopy nurse       and supervised by the endoscopist. The following parameters  were       monitored: oxygen saturation, heart rate, blood pressure, CO2       capnography and response to care. Total physician intraservice time was       18 minutes. Recommendation:           - Patient has a contact number available for                            emergencies. The signs and symptoms of potential                            delayed complications were discussed with the                            patient. Return to normal activities tomorrow.                            Written discharge instructions were provided to the                            patient.                           - Resume previous diet today.                           - Continue present medications.                           - Repeat colonoscopy in 5 years for surveillance. Procedure Code(s):        --- Professional ---                           [phone], Colonoscopy, flexible; diagnostic, including  collection of specimen(s) by brushing or washing,                            when performed (separate procedure)                           99152, Moderate sedation services provided by the                            same physician or other qualified health care                            professional performing the diagnostic or                            therapeutic service that the sedation supports,                            requiring the presence of an independent trained                            observer to assist in the monitoring of the                            patient's level of consciousness and physiological                            status; initial 15 minutes of intraservice time,                            patient age 58 years or older Diagnosis Code(s):        --- Professional ---                           [phone], Personal history of other malignant                            neoplasm of large intestine                           Z98.0, Intestinal bypass and  anastomosis status                           K64.4, Residual hemorrhoidal skin tags CPT copyright 2016 [employer]. All rights reserved. The codes documented in this report are preliminary and upon coder review may  be revised to meet current compliance requirements. Hildred Laser, MD Hildred Laser, MD 11/09/2016 2:26:32 PM This report has been signed electronically. Number of Addenda: 0

## 2016-11-09 NOTE — Discharge Instructions (Signed)
Resume usual medications and diet. Can take Imodium OTC 2 mg up to twice daily as needed for diarrhea. No driving for 24 hours. Next colonoscopy in 5 years.   Colonoscopy, Adult, Care After This sheet gives you information about how to care for yourself after your procedure. Your doctor may also give you more specific instructions. If you have problems or questions, call your doctor. Follow these instructions at home: General instructions    For the first 24 hours after the procedure:  Do not drive or use machinery.  Do not sign important documents.  Do not drink alcohol.  Do your daily activities more slowly than normal.  Eat foods that are soft and easy to digest.  Rest often.  Take over-the-counter or prescription medicines only as told by your doctor.  It is up to you to get the results of your procedure. Ask your doctor, or the department performing the procedure, when your results will be ready. To help cramping and bloating:   Try walking around.  Put heat on your belly (abdomen) as told by your doctor. Use a heat source that your doctor recommends, such as a moist heat pack or a heating pad.  Put a towel between your skin and the heat source.  Leave the heat on for 20-30 minutes.  Remove the heat if your skin turns bright red. This is especially important if you cannot feel pain, heat, or cold. You can get burned. Eating and drinking   Drink enough fluid to keep your pee (urine) clear or pale yellow.  Return to your normal diet as told by your doctor. Avoid heavy or fried foods that are hard to digest.  Avoid drinking alcohol for as long as told by your doctor. Contact a doctor if:  You have blood in your poop (stool) 2-3 days after the procedure. Get help right away if:  You have more than a small amount of blood in your poop.  You see large clumps of tissue (blood clots) in your poop.  Your belly is swollen.  You feel sick to your stomach  (nauseous).  You throw up (vomit).  You have a fever.  You have belly pain that gets worse, and medicine does not help your pain. This information is not intended to replace advice given to you by your health care provider. Make sure you discuss any questions you have with your health care provider. Document Released: 08/04/2010 Document Revised: 03/26/2016 Document Reviewed: 03/26/2016 Elsevier Interactive Patient Education  2017 [employer].

## 2016-11-13 ENCOUNTER — Encounter (HOSPITAL_COMMUNITY): Payer: Self-pay | Admitting: Internal Medicine

## 2017-09-13 DEATH — deceased
